# Patient Record
Sex: Female | Born: 1935 | Smoking: Never smoker
Health system: Southern US, Community
[De-identification: ages and names within clinical notes are randomized; demographics above are authoritative.]

## PROBLEM LIST (undated history)

## (undated) DIAGNOSIS — I4891 Unspecified atrial fibrillation: Secondary | ICD-10-CM

## (undated) DIAGNOSIS — I2510 Atherosclerotic heart disease of native coronary artery without angina pectoris: Secondary | ICD-10-CM

## (undated) DIAGNOSIS — K219 Gastro-esophageal reflux disease without esophagitis: Secondary | ICD-10-CM

## (undated) DIAGNOSIS — E785 Hyperlipidemia, unspecified: Secondary | ICD-10-CM

## (undated) DIAGNOSIS — I714 Abdominal aortic aneurysm, without rupture: Secondary | ICD-10-CM

## (undated) DIAGNOSIS — I10 Essential (primary) hypertension: Secondary | ICD-10-CM

## (undated) DIAGNOSIS — M810 Age-related osteoporosis without current pathological fracture: Secondary | ICD-10-CM

## (undated) DIAGNOSIS — E538 Deficiency of other specified B group vitamins: Secondary | ICD-10-CM

## (undated) HISTORY — DX: Atherosclerotic heart disease of native coronary artery without angina pectoris: I25.10

## (undated) HISTORY — PX: TONSILLECTOMY: SUR1361

## (undated) HISTORY — DX: Deficiency of other specified B group vitamins: E53.8

## (undated) HISTORY — DX: Abdominal aortic aneurysm, without rupture: I71.4

## (undated) HISTORY — PX: VAGINAL HYSTERECTOMY: SHX2639

## (undated) HISTORY — DX: Age-related osteoporosis without current pathological fracture: M81.0

## (undated) HISTORY — DX: Gastro-esophageal reflux disease without esophagitis: K21.9

## (undated) HISTORY — PX: CATARACT EXTRACTION, BILATERAL: SHX1313

## (undated) HISTORY — PX: CHOLECYSTECTOMY: SHX55

## (undated) HISTORY — DX: Unspecified atrial fibrillation: I48.91

## (undated) HISTORY — DX: Hyperlipidemia, unspecified: E78.5

## (undated) HISTORY — DX: Essential (primary) hypertension: I10

## (undated) HISTORY — PX: APPENDECTOMY: SHX54

---

## 1989-11-02 HISTORY — PX: CORONARY ARTERY BYPASS GRAFT: SHX141

## 1990-11-02 DIAGNOSIS — I714 Abdominal aortic aneurysm, without rupture, unspecified: Secondary | ICD-10-CM

## 1990-11-02 HISTORY — DX: Abdominal aortic aneurysm, without rupture, unspecified: I71.40

## 1990-11-02 HISTORY — DX: Abdominal aortic aneurysm, without rupture: I71.4

## 1990-11-02 HISTORY — PX: ABDOMINAL AORTIC ANEURYSM REPAIR: SUR1152

## 2005-10-20 ENCOUNTER — Ambulatory Visit: Payer: Self-pay | Admitting: Gastroenterology

## 2006-09-07 ENCOUNTER — Ambulatory Visit: Payer: Self-pay | Admitting: Family Medicine

## 2007-05-12 ENCOUNTER — Ambulatory Visit: Payer: Self-pay | Admitting: Family Medicine

## 2007-11-03 HISTORY — PX: CORONARY ANGIOPLASTY: SHX604

## 2007-12-13 ENCOUNTER — Inpatient Hospital Stay: Payer: Self-pay | Admitting: Specialist

## 2007-12-13 ENCOUNTER — Other Ambulatory Visit: Payer: Self-pay

## 2007-12-14 ENCOUNTER — Other Ambulatory Visit: Payer: Self-pay

## 2008-05-21 ENCOUNTER — Ambulatory Visit: Payer: Self-pay | Admitting: Family Medicine

## 2009-04-19 ENCOUNTER — Ambulatory Visit: Payer: Self-pay | Admitting: Unknown Physician Specialty

## 2009-05-23 ENCOUNTER — Ambulatory Visit: Payer: Self-pay | Admitting: Family Medicine

## 2009-09-05 ENCOUNTER — Emergency Department: Payer: Self-pay | Admitting: Emergency Medicine

## 2012-08-01 ENCOUNTER — Inpatient Hospital Stay: Payer: Self-pay | Admitting: Internal Medicine

## 2012-08-01 LAB — COMPREHENSIVE METABOLIC PANEL
Alkaline Phosphatase: 93 U/L (ref 50–136)
BUN: 29 mg/dL — ABNORMAL HIGH (ref 7–18)
Creatinine: 0.92 mg/dL (ref 0.60–1.30)
EGFR (Non-African Amer.): 57 — ABNORMAL LOW
Glucose: 132 mg/dL — ABNORMAL HIGH (ref 65–99)
Osmolality: 295 (ref 275–301)
Potassium: 4 mmol/L (ref 3.5–5.1)
SGOT(AST): 31 U/L (ref 15–37)
Sodium: 144 mmol/L (ref 136–145)

## 2012-08-01 LAB — CBC WITH DIFFERENTIAL/PLATELET
Basophil #: 0 10*3/uL (ref 0.0–0.1)
Basophil %: 0.3 %
Eosinophil #: 0 10*3/uL (ref 0.0–0.7)
Eosinophil %: 0.2 %
HGB: 11.7 g/dL — ABNORMAL LOW (ref 12.0–16.0)
Lymphocyte %: 27.5 %
MCH: 29 pg (ref 26.0–34.0)
Monocyte #: 0.7 x10 3/mm (ref 0.2–0.9)
Neutrophil %: 58.7 %
Platelet: 173 10*3/uL (ref 150–440)
RBC: 4.02 10*6/uL (ref 3.80–5.20)

## 2012-08-01 LAB — LIPASE, BLOOD: Lipase: 265 U/L (ref 73–393)

## 2012-08-01 LAB — TROPONIN I: Troponin-I: <0.02 ng/mL

## 2012-08-02 DIAGNOSIS — I4891 Unspecified atrial fibrillation: Secondary | ICD-10-CM

## 2012-08-02 DIAGNOSIS — I369 Nonrheumatic tricuspid valve disorder, unspecified: Secondary | ICD-10-CM

## 2012-08-02 LAB — BASIC METABOLIC PANEL
Calcium, Total: 8.3 mg/dL — ABNORMAL LOW (ref 8.5–10.1)
Chloride: 107 mmol/L (ref 98–107)
Co2: 28 mmol/L (ref 21–32)
Creatinine: 0.83 mg/dL (ref 0.60–1.30)
EGFR (African American): >60
EGFR (Non-African Amer.): >60
Osmolality: 288 (ref 275–301)
Potassium: 4 mmol/L (ref 3.5–5.1)

## 2012-08-02 LAB — CBC WITH DIFFERENTIAL/PLATELET
Basophil %: 0.2 %
Eosinophil #: 0 10*3/uL (ref 0.0–0.7)
HGB: 10.8 g/dL — ABNORMAL LOW (ref 12.0–16.0)
Lymphocyte #: 1.1 10*3/uL (ref 1.0–3.6)
Lymphocyte %: 29.4 %
MCH: 27.8 pg (ref 26.0–34.0)
MCHC: 32.2 g/dL (ref 32.0–36.0)
MCV: 86 fL (ref 80–100)
Monocyte #: 0.6 x10 3/mm (ref 0.2–0.9)
Neutrophil #: 2.1 10*3/uL (ref 1.4–6.5)
Neutrophil %: 55.4 %
Platelet: 145 10*3/uL — ABNORMAL LOW (ref 150–440)
RDW: 14.2 % (ref 11.5–14.5)
WBC: 3.9 10*3/uL (ref 3.6–11.0)

## 2012-08-02 LAB — TROPONIN I
Troponin-I: <0.02 ng/mL
Troponin-I: <0.02 ng/mL

## 2012-08-02 LAB — LIPID PANEL
Cholesterol: 105 mg/dL (ref 0–200)
Ldl Cholesterol, Calc: 40 mg/dL (ref 0–100)
Triglycerides: 75 mg/dL (ref 0–200)
VLDL Cholesterol, Calc: 15 mg/dL (ref 5–40)

## 2012-08-02 LAB — CK TOTAL AND CKMB (NOT AT ARMC)
CK, Total: 50 U/L (ref 21–215)
CK-MB: 1 ng/mL (ref 0.5–3.6)

## 2012-08-03 ENCOUNTER — Telehealth: Payer: Self-pay

## 2012-08-03 NOTE — Telephone Encounter (Signed)
Pt's dtr called to say pt did not have a lot of u/o this am, so she gave her an extra 20 mg lasix at 1100.  She still did not have very much u/o so she gave her an add'l 20 mg lasix at 1pm.  Dtr is concerned about the decreased u/o. Dtr says pt still has some DOE and feels better at rest.  She says heart rate is controlled. I had dtr hold while I discussed with Dr. Mariah Milling. Per Dr. Mariah Milling, he asks that I reassure pt and dtr that u/o should increase over time and pt may take lasix as she did today tomorrow for worsening sob, with extra KCL.  We will keep her appt as scheduled for Friday 10/4. Dtr was informed of this and will call us tomm if symptoms do not improve/if u/o remains low.

## 2012-08-03 NOTE — Telephone Encounter (Signed)
TCM call: I called pt to assess how she is feeling since d/c yesterday. She says she feel very weak and sob.  She says she felt this way in hosp, but was hoping to feel better at home.  She weighed herself last night , when she got home from hospital, and thinks her weight is up 3 pounds today. She lives alone but has the support of her dtr Pt confirms compliance with her medications and has taken these as prescribed this am. She does not have BP cuff to monitor BP/HR so she is unsure of these results  She has appt with Dr. Mariah Milling 10/4. She thinks she is ok to wait until appt to come in, but I told her I will call Dr. Mariah Milling to discuss and call her back.  Understanding verb.  She confirms she has a walker she uses for assist and will keep portable phone with her

## 2012-08-03 NOTE — Telephone Encounter (Signed)
Addendum: Pt is interested in CHF home health care, but is concerned about the cost.  I have called Tim Justis with [facility] and LMT[facility].

## 2012-08-03 NOTE — Telephone Encounter (Signed)
I spoke with Dr. Mariah Milling who says pt has pulmonary HTN with atrial fib and may need add'l lasix today and tomm to help with symptoms.  "Patient may take an extra 20 mg lasix today after lunch if urine output is adequate after this morning's lasix dose.  She may take an extra 40 mg lasix today after lunch if urine output is less than adequate after morning dose.  She will need to take extra 20 meq of KCL with extra PM lasix dose. She may repeat this regimen tomorrow 10/3 if still symptomatic.  She should alos monitor BP and HR at home with BP cuff.  Keep appt scheduled for 10/4 unless she needs to be seen sooner."  V.O Dr. Alvis Lemmings, RN  "If patient is agreeable, arrange CHF home health nurse". V.O Dr. Alvis Lemmings, RN  I will call pt to advise.

## 2012-08-03 NOTE — Telephone Encounter (Signed)
Dtr called and say she just got to pt's home.  She says she is unable to tell if pt is more sob than usual, but will stay with her 1-2 hours and call me back to give me her opinion.  In the meantime, she says pt is urinating "Ok" but not "alot". She will give pt extra 40 mg lasix PO today at 1100 with extra KCL.  She will call me back in 1-2 hours to update me on pt. I educated dtr on how to check radial pulse since pt does not have BP cuff at home. She will try to check pulse as well.  Dtr says pt used to be a pt of Dr. Lady Gary and also sees [facility] in [place].  We will try to get these records.  Dtr also wanted me to know pt found a "card in her wallet" that tells of [employer] in 1992. Dtr does not think pt told MD about this in hospital. We will add to her records.  I will await dtr's call back with her opinion on pt and if she needs to be seen today.

## 2012-08-03 NOTE — Telephone Encounter (Signed)
I called pt to assess symptoms.  She says she is still sitting in chair waiting on dtr, Bonita Quin, who is 8 miles away. Pt still has my name and # and will have dtr call me when she gets to pt's home  I also explained [facility] unable to take pt but I will call other agencies if pt is interested. She says she may consider this but will wait and talk with dtr further.

## 2012-08-03 NOTE — Telephone Encounter (Signed)
Haley Lee with [facility] called back. He explained since pt has [employer], they are not contracted with [facility] so services would not be covered. He suggests calling another agency.

## 2012-08-03 NOTE — Telephone Encounter (Signed)
I called pt back to inform pt of new orders per Dr. Mariah Milling. She says dtr helps her with her meds and gave me permission to call dtr, Bonita Quin, at [phone]. While speaking with me, she became very sob, unable to finish sentences without pausing for breath.  I asked her if she needs immediate help ([phone]), she declines, says her dtr lives nearby. I had pt hold on phone while I called dtr. I spoke with Bonita Quin and made her aware of pt's symptoms.  She says she spoke with pt this am and her only complaint was weakness.  She was unaware of pt's SOB.  Dtr is going to check on pt now. She will call me when she gets to pt home. I also informed dtr of Dr. Windell Hummingbird plan to give extra lasix and KCL as described below. She verb understanding and will implement if needed, once she checks on pt. I spoke with pt on phone again to let her know dtr is on her way.  I stayed on phone with pt for a few minutes.  She is going to sit in chair until dtr gets there. I explained pt may need to be seen today.  She verb. Understanding and c/o "shaking inside". This may be atrial fib she is describing but I am unsure of her rate. I made sure pt has my name and # and she is to call me back ASAP. I will also call her in a few minutes to check on her again until dtr gets there.

## 2012-08-04 ENCOUNTER — Telehealth: Payer: Self-pay

## 2012-08-04 NOTE — Telephone Encounter (Signed)
Pt's dtr called this am to say pt accidentally took 2 lasix 20 mg tablets this am with 2 KCL instead of 1 lasix 20 mg.  I advised ok since she was to take an extra 20 mg lasix at 1100 this am. I advised dtr to not give pt another lasix 20 mg this afternoon unless u/o is decreased (as instructed yesterday). Dtr verb. Understanding. Says U/O has actually improved since early this am.  Says pt's DOE has improved and confirms appt with [place] tomm.

## 2012-08-05 ENCOUNTER — Encounter: Payer: Self-pay | Admitting: Cardiovascular Disease

## 2012-08-05 ENCOUNTER — Ambulatory Visit (INDEPENDENT_AMBULATORY_CARE_PROVIDER_SITE_OTHER): Payer: Medicare Other | Admitting: Cardiovascular Disease

## 2012-08-05 VITALS — BP 161/102 | HR 107 | Ht 61.0 in | Wt 155.8 lb

## 2012-08-05 DIAGNOSIS — E785 Hyperlipidemia, unspecified: Secondary | ICD-10-CM | POA: Insufficient documentation

## 2012-08-05 DIAGNOSIS — I5031 Acute diastolic (congestive) heart failure: Secondary | ICD-10-CM | POA: Insufficient documentation

## 2012-08-05 DIAGNOSIS — I509 Heart failure, unspecified: Secondary | ICD-10-CM

## 2012-08-05 DIAGNOSIS — I10 Essential (primary) hypertension: Secondary | ICD-10-CM

## 2012-08-05 DIAGNOSIS — I25810 Atherosclerosis of coronary artery bypass graft(s) without angina pectoris: Secondary | ICD-10-CM | POA: Insufficient documentation

## 2012-08-05 DIAGNOSIS — I4891 Unspecified atrial fibrillation: Secondary | ICD-10-CM

## 2012-08-05 DIAGNOSIS — R609 Edema, unspecified: Secondary | ICD-10-CM

## 2012-08-05 MED ORDER — METOPROLOL TARTRATE 50 MG PO TABS: 50.0000 mg | ORAL_TABLET | Freq: Two times a day (BID) | ORAL | Status: DC

## 2012-08-05 MED ORDER — DIGOXIN 125 MCG PO TABS: 0.1250 mg | ORAL_TABLET | Freq: Every day | ORAL | Status: DC

## 2012-08-05 NOTE — Assessment & Plan Note (Signed)
Leg edema likely secondary to diastolic CHF and possible side effects from calcium channel blocker. If edema persists, we could slowly decrease the dose of diltiazem, increase metoprolol and continue digoxin for rate control. We would make this change on her next visit.

## 2012-08-05 NOTE — Assessment & Plan Note (Signed)
Currently with no symptoms of angina. No further workup at this time. Continue current medication regimen. 

## 2012-08-05 NOTE — Patient Instructions (Addendum)
  Please start metoprolol twice a day, 50 mg Hold the losartan,  Monitor your blood pressure Continue lasix with potassium one a day Please start digoxin one a day  Please call us if you have new issues that need to be addressed before your next appt.  Your physician wants you to follow-up in: 1 to 2 weeks

## 2012-08-05 NOTE — Assessment & Plan Note (Addendum)
Heart rate continues to be elevated. We will add metoprolol 50 mg twice a day  and low-dose digoxin 0.125 mg daily. We did discuss TE cardioversion. We will try medical management into next week.

## 2012-08-05 NOTE — Assessment & Plan Note (Signed)
Diastolic CHF likely secondary to atrial fibrillation and rapid rate. We will work on rate control. We have suggested she stay on Lasix daily. If she continues to have significant symptoms, we may need to consider cardioversion.

## 2012-08-05 NOTE — Assessment & Plan Note (Signed)
We have added. metoprolol today and has suggested she closely monitor her blood pressure at home

## 2012-08-05 NOTE — Assessment & Plan Note (Signed)
We have suggested she stay on a statin given history of coronary artery disease.

## 2012-08-05 NOTE — Progress Notes (Signed)
Patient ID: Haley Lee, female    DOB: [birth year], 78 y.o.   MRN: [id number]  HPI Comments: Haley Lee is a very pleasant 78 year old woman with a history of coronary artery disease, bypass surgery, angioplasty, hypertension, recent mechanical fall several weeks ago who is not feeling well with shortness of breath, weakness and palpitations and presented to the hospital 08/01/2012, found to be in atrial fibrillation with RVR. She was started on diltiazem, anticoagulation and diuretic with mild improvement of her symptoms. Cardiac enzymes were negative.  She presents today for followup. She reports continued fatigue, lower extremity edema despite extra doses of diuretic. She is very concerned about the extra pulsation that she sees in her neck and her friends have been asking her what is "wrong with her". At nighttime she feels palpitations and is very symptomatic.  Lab work from the hospital shows TSH 2.6, negative cardiac enzymes  Echocardiogram showed ejection fraction 40-45%, mildly dilated left atrium, mild to moderate MR, severe TR with right ventricular systolic pressure 50-60 mm of mercury, mild to moderate aortic valve regurgitation  EKG today shows atrial fibrillation with ventricular rate 107 beats per minute, poor R-wave progression through the precordial leads, left axis deviation, possible left anterior fascicular block   Outpatient Encounter Prescriptions as of 08/05/2012  Medication Sig Dispense Refill  . aspirin 81 MG tablet Take 81 mg by mouth daily.      . Calcium Carbonate-Vitamin D (CALCIUM 600 + D PO) Take by mouth.      . Cholecalciferol (VITAMIN D-3 PO) Take 400 Units by mouth daily.      Marland Kitchen diltiazem (DILACOR XR) 180 MG 24 hr capsule Take 180 mg by mouth daily.      . furosemide (LASIX) 20 MG tablet Take 20 mg by mouth daily.       . meloxicam (MOBIC) 15 MG tablet Take 15 mg by mouth daily.      . potassium chloride SA (K-DUR,KLOR-CON) 20 MEQ tablet Take 20 mEq by mouth  daily.      . simvastatin (ZOCOR) 40 MG tablet Take 40 mg by mouth every evening.      . warfarin (COUMADIN) 3 MG tablet Take 3 mg by mouth daily.      Marland Kitchen  losartan (COZAAR) 100 MG tablet Take 100 mg by mouth daily.        Review of Systems  Constitutional: Negative.   HENT: Negative.   Eyes: Negative.   Respiratory: Positive for shortness of breath.   Cardiovascular: Positive for palpitations and leg swelling.  Gastrointestinal: Negative.   Musculoskeletal: Negative.   Skin: Negative.   Neurological: Negative.   Hematological: Negative.   Psychiatric/Behavioral: Negative.   All other systems reviewed and are negative.    BP 161/102  Pulse 107  Ht 5\' 1"  (1.549 m)  Wt 155 lb 12 oz (70.648 kg)  BMI 29.43 kg/m2  SpO2 97%  Physical Exam  Nursing note and vitals reviewed. Constitutional: She is oriented to person, place, and time. She appears well-developed and well-nourished.  HENT:  Head: Normocephalic.  Nose: Nose normal.  Mouth/Throat: Oropharynx is clear and moist.  Eyes: Conjunctivae normal are normal. Pupils are equal, round, and reactive to light.  Neck: Normal range of motion. Neck supple. No JVD present.  Cardiovascular: S1 normal, S2 normal, normal heart sounds and intact distal pulses.  An irregularly irregular rhythm present. Tachycardia present.  Exam reveals no gallop and no friction rub.   No murmur heard.  Trace ankle edema  Pulmonary/Chest: Effort normal and breath sounds normal. No respiratory distress. She has no wheezes. She has no rales. She exhibits no tenderness.  Abdominal: Soft. Bowel sounds are normal. She exhibits no distension. There is no tenderness.  Musculoskeletal: Normal range of motion. She exhibits edema. She exhibits no tenderness.  Lymphadenopathy:    She has no cervical adenopathy.  Neurological: She is alert and oriented to person, place, and time. Coordination normal.  Skin: Skin is warm and dry. No rash noted. No erythema.    Psychiatric: She has a normal mood and affect. Her behavior is normal. Judgment and thought content normal.         Assessment and Plan

## 2012-08-08 ENCOUNTER — Telehealth: Payer: Self-pay | Admitting: Cardiovascular Disease

## 2012-08-08 NOTE — Telephone Encounter (Signed)
I spoke with pt's dtr, Bonita Quin. She says no one is managing pt's coumadin.  I explained importance of this.  Dtr verb. Understanding and would like to have pt enrolled in our clinic.  Appt made for this Wednesday at 3:40 for new pt appt in coumadin clinic.   I explained Dr. Mariah Milling needs to assess this as well as EKG before scheduling for cardioversion/TEE.  Understanding verb. Will get INR and EKG Wednesday 08/10/12

## 2012-08-08 NOTE — Telephone Encounter (Signed)
Discussed with Dr. Mariah Milling, who says we need to get most recent INRs from provider.  Pt should monitor HR and BP at home.  He needs to know what her HR have been.  He is willing to schedule TEE/Cardioversion but we need to get INR's. I will call pt to advise.

## 2012-08-08 NOTE — Telephone Encounter (Signed)
Pt's dtr says pt started metoprolol and digoxin as prescribed on Friday by Dr. Mariah Milling. She says pt is still c/o severe DOE as well as increased fatigue/weakness on exertion.  Also c/o left sided pain under left breast as well as left arm pain.  Pt is wanting to go ahead and proceed with TEE/Cardioversion. Edema is unchanged while taking lasix.  I told dtr I would share this info with Dr. Mariah Milling and call her back. Understanding verb.

## 2012-08-08 NOTE — Telephone Encounter (Signed)
Pt daughter was calling to give an update on pt. Pt does not have any energy and heart races with excertion. Pain in arms and under ribs.

## 2012-08-10 ENCOUNTER — Ambulatory Visit (INDEPENDENT_AMBULATORY_CARE_PROVIDER_SITE_OTHER): Payer: Medicare Other

## 2012-08-10 ENCOUNTER — Other Ambulatory Visit: Payer: Self-pay | Admitting: Cardiovascular Disease

## 2012-08-10 DIAGNOSIS — Z7901 Long term (current) use of anticoagulants: Secondary | ICD-10-CM

## 2012-08-10 DIAGNOSIS — I4891 Unspecified atrial fibrillation: Secondary | ICD-10-CM

## 2012-08-10 MED ORDER — POTASSIUM CHLORIDE CRYS ER 20 MEQ PO TBCR: 20.0000 meq | EXTENDED_RELEASE_TABLET | Freq: Every day | ORAL | Status: DC

## 2012-08-10 MED ORDER — FUROSEMIDE 20 MG PO TABS: 20.0000 mg | ORAL_TABLET | Freq: Every day | ORAL | Status: DC

## 2012-08-10 NOTE — Telephone Encounter (Signed)
Please review and refill, Thank you. 

## 2012-08-10 NOTE — Patient Instructions (Signed)

## 2012-08-10 NOTE — Telephone Encounter (Signed)
Refilled Potassium and Lasix.

## 2012-08-11 ENCOUNTER — Other Ambulatory Visit: Payer: Self-pay | Admitting: Cardiovascular Disease

## 2012-08-11 MED ORDER — WARFARIN SODIUM 6 MG PO TABS: 6.0000 mg | ORAL_TABLET | Freq: Every day | ORAL | Status: DC

## 2012-08-11 NOTE — Telephone Encounter (Signed)
Spoke with Rosanne Ashing about the confusion with warfarin. Told Jim to cancel the warfarin 6 mg that was phoned in because the patient and daughter thought the patient would run out. There was some confusion with some insurance and patient's daughter was confused on what to do.  I told the patient's daughter we would cancel the warfarin 6 mg since the patient is coming in on Monday for a PT/INR check. I told the patient's daughter the dose may change again and if she has enough until Monday no need to change the Rx at this time.

## 2012-08-11 NOTE — Telephone Encounter (Signed)
Pt needs script written so that she is taking 2 3mg  daily due to increase per [facility] yesterday

## 2012-08-12 NOTE — Telephone Encounter (Signed)
The patient's daughter will have the patient continue with the warfarin 3 mg taking two tablets as instructed by the coag clinic.

## 2012-08-15 ENCOUNTER — Ambulatory Visit (INDEPENDENT_AMBULATORY_CARE_PROVIDER_SITE_OTHER): Payer: Medicare Other

## 2012-08-15 ENCOUNTER — Encounter: Payer: Self-pay | Admitting: Cardiovascular Disease

## 2012-08-15 ENCOUNTER — Ambulatory Visit (INDEPENDENT_AMBULATORY_CARE_PROVIDER_SITE_OTHER): Payer: Medicare Other | Admitting: Cardiovascular Disease

## 2012-08-15 VITALS — BP 124/64 | HR 53 | Ht 61.0 in | Wt 146.8 lb

## 2012-08-15 DIAGNOSIS — I4891 Unspecified atrial fibrillation: Secondary | ICD-10-CM

## 2012-08-15 DIAGNOSIS — Z7901 Long term (current) use of anticoagulants: Secondary | ICD-10-CM

## 2012-08-15 DIAGNOSIS — I5031 Acute diastolic (congestive) heart failure: Secondary | ICD-10-CM

## 2012-08-15 DIAGNOSIS — I509 Heart failure, unspecified: Secondary | ICD-10-CM

## 2012-08-15 DIAGNOSIS — R609 Edema, unspecified: Secondary | ICD-10-CM

## 2012-08-15 DIAGNOSIS — I25810 Atherosclerosis of coronary artery bypass graft(s) without angina pectoris: Secondary | ICD-10-CM

## 2012-08-15 LAB — POCT INR: INR: 1.6

## 2012-08-15 MED ORDER — POTASSIUM CHLORIDE CRYS ER 20 MEQ PO TBCR: 20.0000 meq | EXTENDED_RELEASE_TABLET | Freq: Every day | ORAL | Status: AC | PRN

## 2012-08-15 MED ORDER — WARFARIN SODIUM 6 MG PO TABS: ORAL_TABLET | ORAL | Status: DC

## 2012-08-15 MED ORDER — FUROSEMIDE 20 MG PO TABS: 20.0000 mg | ORAL_TABLET | Freq: Every day | ORAL | Status: AC | PRN

## 2012-08-15 MED ORDER — WARFARIN SODIUM 7.5 MG PO TABS: ORAL_TABLET | ORAL | Status: DC

## 2012-08-15 MED ORDER — METOPROLOL TARTRATE 25 MG PO TABS: 25.0000 mg | ORAL_TABLET | Freq: Two times a day (BID) | ORAL | Status: DC

## 2012-08-15 MED ORDER — DILTIAZEM HCL ER 120 MG PO CP24: 120.0000 mg | ORAL_CAPSULE | Freq: Every day | ORAL | Status: AC

## 2012-08-15 MED ORDER — DIGOXIN 125 MCG PO TABS: 0.1250 mg | ORAL_TABLET | ORAL | Status: DC

## 2012-08-15 NOTE — Assessment & Plan Note (Signed)
Edema has resolved. Shortness of breath improved. We will change Lasix to as needed for edema.

## 2012-08-15 NOTE — Progress Notes (Signed)
Patient ID: Haley Lee, female    DOB: 1925-02-11, 78 y.o.   MRN: [id number]  HPI Comments: Ms. Linehan is a very pleasant 78 year old woman with a history of coronary artery disease, bypass surgery, angioplasty, hypertension, recent mechanical fall several weeks ago who is not feeling well with shortness of breath, weakness and palpitations and presented to the hospital 08/01/2012, found to be in atrial fibrillation with RVR. She was started on diltiazem, anticoagulation and diuretic with mild improvement of her symptoms. Cardiac enzymes were negative.  She presents today and reports that deep palpation in her neck has improved. She is more "wobbly" on her feet than before. Her daughter is helping her with the medications. Edema has resolved on her current medication regimen.   Lab work from the hospital shows TSH 2.6, negative cardiac enzymes  Echocardiogram showed ejection fraction 40-45%, mildly dilated left atrium, mild to moderate MR, severe TR with right ventricular systolic pressure 50-60 mm of mercury, mild to moderate aortic valve regurgitation  EKG today shows atrial fibrillation with rate 53 beats per minute, poor R-wave progression through the anterior precordial leads, left anterior fascicular block   Outpatient Encounter Prescriptions as of 08/15/2012  Medication Sig Dispense Refill  . aspirin 81 MG tablet Take 81 mg by mouth daily.      . Calcium Carbonate-Vitamin D (CALCIUM 600 + D PO) Take by mouth.      . Cholecalciferol (VITAMIN D-3 PO) Take 400 Units by mouth daily.      . meloxicam (MOBIC) 15 MG tablet Take 15 mg by mouth daily.      . simvastatin (ZOCOR) 40 MG tablet Take 40 mg by mouth every evening.      .  digoxin (LANOXIN) 0.125 MG tablet Take 1 tablet (0.125 mg total) by mouth daily.  90 tablet  3  . diltiazem (DILACOR XR) 180 MG 24 hr capsule Take 180 mg by mouth daily.      .  furosemide (LASIX) 20 MG tablet Take 1 tablet (20 mg total) by mouth daily.  30 tablet   3  .  metoprolol (LOPRESSOR) 50 MG tablet Take 1 tablet (50 mg total) by mouth 2 (two) times daily.  180 tablet  3  .  potassium chloride SA (K-DUR,KLOR-CON) 20 MEQ tablet Take 1 tablet (20 mEq total) by mouth daily.  30 tablet  3  . warfarin (COUMADIN) 6 MG tablet Take 1 tablet daily as directed  34 tablet  3     Review of Systems  Constitutional: Negative.   HENT: Negative.   Eyes: Negative.   Cardiovascular: Positive for palpitations.  Gastrointestinal: Negative.   Musculoskeletal: Positive for gait problem.  Skin: Negative.   Neurological: Negative.   Hematological: Negative.   Psychiatric/Behavioral: Negative.   All other systems reviewed and are negative.    BP 124/64  Pulse 53  Ht 5\' 1"  (1.549 m)  Wt 146 lb 12 oz (66.565 kg)  BMI 27.73 kg/m2  Physical Exam  Nursing note and vitals reviewed. Constitutional: She is oriented to person, place, and time. She appears well-developed and well-nourished.  HENT:  Head: Normocephalic.  Nose: Nose normal.  Mouth/Throat: Oropharynx is clear and moist.  Eyes: Conjunctivae normal are normal. Pupils are equal, round, and reactive to light.  Neck: Normal range of motion. Neck supple. No JVD present.  Cardiovascular: Normal rate, S1 normal, S2 normal, normal heart sounds and intact distal pulses.  An irregularly irregular rhythm present. Exam reveals no gallop and no  friction rub.   No murmur heard.      Trace ankle edema  Pulmonary/Chest: Effort normal and breath sounds normal. No respiratory distress. She has no wheezes. She has no rales. She exhibits no tenderness.  Abdominal: Soft. Bowel sounds are normal. She exhibits no distension. There is no tenderness.  Musculoskeletal: Normal range of motion. She exhibits no tenderness.  Lymphadenopathy:    She has no cervical adenopathy.  Neurological: She is alert and oriented to person, place, and time. Coordination normal.  Skin: Skin is warm and dry. No rash noted. No erythema.    Psychiatric: She has a normal mood and affect. Her behavior is normal. Judgment and thought content normal.         Assessment and Plan

## 2012-08-15 NOTE — Patient Instructions (Addendum)
Please decrease the diltiazem/cardizem to 120 mg pill once a day Take digoxin every other day (or four times a week) Please cut the metoprolol in 1/2 and take twice a day  Take warfarin 6 mg daily with 9 mg (two days a week, 1 1/2 pills twice a week)  Weight yourself at home daily Goal weight is 140 to 144 If your weight goes up more then 4 pounds, then take a lasix with potassium Only take potassium when you take lasix  Please call us if you have new issues that need to be addressed before your next appt.  Your physician wants you to follow-up in: 1 month.

## 2012-08-15 NOTE — Assessment & Plan Note (Signed)
Edema has resolved with diuretic. We'll change Lasix to as needed

## 2012-08-15 NOTE — Assessment & Plan Note (Signed)
Currently with no symptoms of angina. No further workup at this time. Continue current medication regimen. 

## 2012-08-15 NOTE — Assessment & Plan Note (Signed)
Heart rate is slowed today. We will change digoxin to every other day, decrease metoprolol to 25 mg twice a day, decrease diltiazem to 120 mg daily. Continue warfarin. After anticoagulation for one month at appropriate level, consider cardioversion

## 2012-08-15 NOTE — Assessment & Plan Note (Signed)
Warfarin adjusted today, 6 mg daily with 9 mg 2 days per week with check in 10 days.

## 2012-08-22 ENCOUNTER — Telehealth: Payer: Self-pay

## 2012-08-22 NOTE — Telephone Encounter (Signed)
Pt's dtr called, wanted [place] to know pt has had to take lasix with potassium over the last 3 days. Says her weight has gone from 140 pounds to 142 pounds, then jumped to 145 pounds.  Per Dr. Windell Hummingbird last Office note, she was told to take lasix/potassium only PRN weight gain to 144 pounds or greater.  Dtr says pt's u/o is good but weight has not changed. She has weighed 145 pounds over the last 3 days. Denies worsening sob or edema. I educated her on importance of restricting sodium as much as possible and restricting fluid intake.  She will let us know should weight continue to remain greater than 144 pounds.

## 2012-08-24 ENCOUNTER — Ambulatory Visit (INDEPENDENT_AMBULATORY_CARE_PROVIDER_SITE_OTHER): Payer: Medicare Other

## 2012-08-24 DIAGNOSIS — Z7901 Long term (current) use of anticoagulants: Secondary | ICD-10-CM

## 2012-08-24 DIAGNOSIS — I4891 Unspecified atrial fibrillation: Secondary | ICD-10-CM

## 2012-08-25 ENCOUNTER — Telehealth: Payer: Self-pay

## 2012-08-25 NOTE — Telephone Encounter (Signed)
Pt came in for coumadin visit 08/24/12 Says weight is down, sob is better. She will continue to take lasix PRN weight gain over 144 pounds (per dr. Windell Hummingbird instruction at last OV)

## 2012-08-31 ENCOUNTER — Ambulatory Visit (INDEPENDENT_AMBULATORY_CARE_PROVIDER_SITE_OTHER): Payer: Medicare Other

## 2012-08-31 DIAGNOSIS — I4891 Unspecified atrial fibrillation: Secondary | ICD-10-CM

## 2012-08-31 DIAGNOSIS — Z7901 Long term (current) use of anticoagulants: Secondary | ICD-10-CM

## 2012-09-14 ENCOUNTER — Encounter: Payer: Self-pay | Admitting: Cardiovascular Disease

## 2012-09-14 ENCOUNTER — Ambulatory Visit (INDEPENDENT_AMBULATORY_CARE_PROVIDER_SITE_OTHER): Payer: Medicare Other

## 2012-09-14 ENCOUNTER — Ambulatory Visit (INDEPENDENT_AMBULATORY_CARE_PROVIDER_SITE_OTHER): Payer: Medicare Other | Admitting: Cardiovascular Disease

## 2012-09-14 VITALS — BP 124/62 | HR 63 | Ht 61.0 in | Wt 147.2 lb

## 2012-09-14 DIAGNOSIS — I509 Heart failure, unspecified: Secondary | ICD-10-CM

## 2012-09-14 DIAGNOSIS — Z7901 Long term (current) use of anticoagulants: Secondary | ICD-10-CM

## 2012-09-14 DIAGNOSIS — I4891 Unspecified atrial fibrillation: Secondary | ICD-10-CM

## 2012-09-14 DIAGNOSIS — R0602 Shortness of breath: Secondary | ICD-10-CM

## 2012-09-14 DIAGNOSIS — I5031 Acute diastolic (congestive) heart failure: Secondary | ICD-10-CM

## 2012-09-14 DIAGNOSIS — R609 Edema, unspecified: Secondary | ICD-10-CM

## 2012-09-14 DIAGNOSIS — I25810 Atherosclerosis of coronary artery bypass graft(s) without angina pectoris: Secondary | ICD-10-CM

## 2012-09-14 NOTE — Assessment & Plan Note (Signed)
Edema has essentially resolved. She takes Lasix periodically depending on her weight. She seems to have this perfected.

## 2012-09-14 NOTE — Patient Instructions (Addendum)
You are doing well. No medication changes were made.  Please call us if you have new issues that need to be addressed before your next appt.  Your physician wants you to follow-up in: 6 months.  You will receive a reminder letter in the mail two months in advance. If you don't receive a letter, please call our office to schedule the follow-up appointment.   

## 2012-09-14 NOTE — Assessment & Plan Note (Signed)
She appears relatively euvolemic on today's visit No changes to her medications 

## 2012-09-14 NOTE — Assessment & Plan Note (Signed)
Currently with no symptoms of angina. No further workup at this time. Continue current medication regimen. 

## 2012-09-14 NOTE — Assessment & Plan Note (Signed)
Heart rate is well controlled. She continues to be in atrial fibrillation. We did have a long discussion about various treatment options for her. We did discuss cardioversion versus medical management. Her daughter suggests medical management at this time as she is feeling well apart from mild fatigue. She will think about this. If symptoms get worse, I suggested she call us and we will do cardioversion under anesthesia.

## 2012-09-14 NOTE — Progress Notes (Signed)
Patient ID: Nana Cohn, female    DOB: 05/29/1925, 78 y.o.   MRN: [id number]  HPI Comments: Ms. Eckmann is a very pleasant 78 year old woman with a history of coronary artery disease, bypass surgery, angioplasty, hypertension, recent mechanical fall several weeks ago who is not feeling well with shortness of breath, weakness and palpitations and presented to the hospital 08/01/2012, found to be in atrial fibrillation with RVR. She was started on diltiazem, anticoagulation and diuretic with mild improvement of her symptoms. Cardiac enzymes were negative.  On her last clinic visit, we decreased her metoprolol dose, changed diltiazem to every other day and decreased her Cardizem dose. Heart rate was in the low 50s at the time.  She returns today and reports that she feels well overall. She does continue to have fatigue. She denies any significant shortness of breath, edema. Wound continues to be poor. Her daughter is helping her with the medications.   Lab work from the hospital shows TSH 2.6, negative cardiac enzymes  Echocardiogram showed ejection fraction 40-45%, mildly dilated left atrium, mild to moderate MR, severe TR with right ventricular systolic pressure 50-60 mm of mercury, mild to moderate aortic valve regurgitation  EKG today shows atrial fibrillation with rate 77 beats per minute, poor R-wave progression through the anterior precordial leads, left anterior fascicular block   Outpatient Encounter Prescriptions as of 09/14/2012  Medication Sig Dispense Refill  . aspirin 81 MG tablet Take 81 mg by mouth daily.      . Calcium Carbonate-Vitamin D (CALCIUM 600 + D PO) Take by mouth.      . Cholecalciferol (VITAMIN D-3 PO) Take 400 Units by mouth daily.      . digoxin (LANOXIN) 0.125 MG tablet Take 1 tablet (0.125 mg total) by mouth every other day.  45 tablet  3  . diltiazem (DILACOR XR) 120 MG 24 hr capsule Take 1 capsule (120 mg total) by mouth daily.  90 capsule  3  . furosemide  (LASIX) 20 MG tablet Take 1 tablet (20 mg total) by mouth daily as needed.  30 tablet  3  . meloxicam (MOBIC) 15 MG tablet Take 15 mg by mouth daily.      . metoprolol (LOPRESSOR) 25 MG tablet Take 1 tablet (25 mg total) by mouth 2 (two) times daily.  180 tablet  3  . potassium chloride SA (K-DUR,KLOR-CON) 20 MEQ tablet Take 1 tablet (20 mEq total) by mouth daily as needed.  30 tablet  3  . simvastatin (ZOCOR) 40 MG tablet Take 40 mg by mouth every evening.      . warfarin (COUMADIN) 6 MG tablet Take 1 tablet daily as directed  34 tablet  3     Review of Systems  Constitutional: Negative.   HENT: Negative.   Eyes: Negative.   Respiratory: Negative.   Cardiovascular: Positive for palpitations.  Gastrointestinal: Negative.   Musculoskeletal: Positive for gait problem.  Skin: Negative.   Neurological: Negative.   Hematological: Negative.   Psychiatric/Behavioral: Negative.   All other systems reviewed and are negative.    BP 124/62  Pulse 63  Ht 5\' 1"  (1.549 m)  Wt 147 lb 4 oz (66.792 kg)  BMI 27.82 kg/m2  Physical Exam  Nursing note and vitals reviewed. Constitutional: She is oriented to person, place, and time. She appears well-developed and well-nourished.  HENT:  Head: Normocephalic.  Nose: Nose normal.  Mouth/Throat: Oropharynx is clear and moist.  Eyes: Conjunctivae normal are normal. Pupils are equal, round, and  reactive to light.  Neck: Normal range of motion. Neck supple. No JVD present.  Cardiovascular: Normal rate, S1 normal, S2 normal, normal heart sounds and intact distal pulses.  An irregularly irregular rhythm present. Exam reveals no gallop and no friction rub.   No murmur heard.      Trace ankle edema  Pulmonary/Chest: Effort normal and breath sounds normal. No respiratory distress. She has no wheezes. She has no rales. She exhibits no tenderness.  Abdominal: Soft. Bowel sounds are normal. She exhibits no distension. There is no tenderness.  Musculoskeletal:  Normal range of motion. She exhibits no edema and no tenderness.  Lymphadenopathy:    She has no cervical adenopathy.  Neurological: She is alert and oriented to person, place, and time. Coordination normal.  Skin: Skin is warm and dry. No rash noted. No erythema.  Psychiatric: She has a normal mood and affect. Her behavior is normal. Judgment and thought content normal.         Assessment and Plan

## 2012-09-16 ENCOUNTER — Emergency Department: Payer: Self-pay | Admitting: Emergency Medicine

## 2012-09-17 ENCOUNTER — Emergency Department: Payer: Self-pay | Admitting: Emergency Medicine

## 2012-09-17 LAB — BASIC METABOLIC PANEL
Calcium, Total: 8.5 mg/dL (ref 8.5–10.1)
Co2: 28 mmol/L (ref 21–32)
EGFR (African American): 54 — ABNORMAL LOW
EGFR (Non-African Amer.): 46 — ABNORMAL LOW
Glucose: 109 mg/dL — ABNORMAL HIGH (ref 65–99)
Osmolality: 285 (ref 275–301)
Potassium: 3.8 mmol/L (ref 3.5–5.1)
Sodium: 140 mmol/L (ref 136–145)

## 2012-09-17 LAB — CBC
HCT: 34.7 % — ABNORMAL LOW (ref 35.0–47.0)
HGB: 11.9 g/dL — ABNORMAL LOW (ref 12.0–16.0)
MCH: 28.6 pg (ref 26.0–34.0)
MCHC: 34.3 g/dL (ref 32.0–36.0)
MCV: 84 fL (ref 80–100)
RDW: 15.9 % — ABNORMAL HIGH (ref 11.5–14.5)
WBC: 4.8 10*3/uL (ref 3.6–11.0)

## 2012-09-17 LAB — PROTIME-INR
INR: 2.7
Prothrombin Time: 29 secs — ABNORMAL HIGH (ref 11.5–14.7)

## 2012-09-19 ENCOUNTER — Encounter: Payer: Self-pay | Admitting: Cardiovascular Disease

## 2012-09-21 ENCOUNTER — Telehealth: Payer: Self-pay

## 2012-09-21 NOTE — Telephone Encounter (Signed)
Error

## 2012-09-28 ENCOUNTER — Other Ambulatory Visit: Payer: Self-pay

## 2012-09-28 ENCOUNTER — Ambulatory Visit (INDEPENDENT_AMBULATORY_CARE_PROVIDER_SITE_OTHER): Payer: Medicare Other

## 2012-09-28 DIAGNOSIS — I4891 Unspecified atrial fibrillation: Secondary | ICD-10-CM

## 2012-09-28 DIAGNOSIS — Z7901 Long term (current) use of anticoagulants: Secondary | ICD-10-CM

## 2012-09-28 LAB — PROTIME-INR: INR: 8.2 — AB (ref <=1.1)

## 2012-09-28 LAB — POCT INR: INR: 7.8

## 2012-09-28 NOTE — Patient Instructions (Addendum)
Advised pt's daughter to report to ED with any sign and symptoms of abnormal bleeding. Verbalized understanding.

## 2012-10-03 ENCOUNTER — Encounter (INDEPENDENT_AMBULATORY_CARE_PROVIDER_SITE_OTHER): Payer: Medicare Other

## 2012-10-03 ENCOUNTER — Ambulatory Visit (INDEPENDENT_AMBULATORY_CARE_PROVIDER_SITE_OTHER): Payer: Medicare Other | Admitting: Cardiovascular Disease

## 2012-10-03 DIAGNOSIS — I4891 Unspecified atrial fibrillation: Secondary | ICD-10-CM

## 2012-10-03 DIAGNOSIS — Z7901 Long term (current) use of anticoagulants: Secondary | ICD-10-CM

## 2012-10-07 ENCOUNTER — Ambulatory Visit: Payer: Self-pay | Admitting: Podiatry

## 2012-10-07 ENCOUNTER — Ambulatory Visit (INDEPENDENT_AMBULATORY_CARE_PROVIDER_SITE_OTHER): Payer: Medicare Other

## 2012-10-07 DIAGNOSIS — I4891 Unspecified atrial fibrillation: Secondary | ICD-10-CM

## 2012-10-07 DIAGNOSIS — Z7901 Long term (current) use of anticoagulants: Secondary | ICD-10-CM

## 2012-10-19 ENCOUNTER — Ambulatory Visit (INDEPENDENT_AMBULATORY_CARE_PROVIDER_SITE_OTHER): Payer: Medicare Other

## 2012-10-19 DIAGNOSIS — Z7901 Long term (current) use of anticoagulants: Secondary | ICD-10-CM

## 2012-10-19 DIAGNOSIS — I4891 Unspecified atrial fibrillation: Secondary | ICD-10-CM

## 2012-10-19 LAB — POCT INR: INR: 2.8

## 2012-11-09 ENCOUNTER — Ambulatory Visit (INDEPENDENT_AMBULATORY_CARE_PROVIDER_SITE_OTHER): Payer: Medicare Other

## 2012-11-09 DIAGNOSIS — I4891 Unspecified atrial fibrillation: Secondary | ICD-10-CM

## 2012-11-09 DIAGNOSIS — Z7901 Long term (current) use of anticoagulants: Secondary | ICD-10-CM

## 2012-12-07 ENCOUNTER — Ambulatory Visit (INDEPENDENT_AMBULATORY_CARE_PROVIDER_SITE_OTHER): Payer: Medicare Other

## 2012-12-07 DIAGNOSIS — I4891 Unspecified atrial fibrillation: Secondary | ICD-10-CM

## 2012-12-07 DIAGNOSIS — Z7901 Long term (current) use of anticoagulants: Secondary | ICD-10-CM

## 2012-12-28 ENCOUNTER — Ambulatory Visit (INDEPENDENT_AMBULATORY_CARE_PROVIDER_SITE_OTHER): Payer: Medicare Other

## 2013-01-18 ENCOUNTER — Ambulatory Visit (INDEPENDENT_AMBULATORY_CARE_PROVIDER_SITE_OTHER): Payer: Medicare Other

## 2013-01-18 DIAGNOSIS — I4891 Unspecified atrial fibrillation: Secondary | ICD-10-CM

## 2013-02-08 ENCOUNTER — Ambulatory Visit (INDEPENDENT_AMBULATORY_CARE_PROVIDER_SITE_OTHER): Payer: Medicare Other

## 2013-02-08 DIAGNOSIS — I4891 Unspecified atrial fibrillation: Secondary | ICD-10-CM

## 2013-02-08 DIAGNOSIS — Z7901 Long term (current) use of anticoagulants: Secondary | ICD-10-CM

## 2013-02-08 MED ORDER — WARFARIN SODIUM 6 MG PO TABS: ORAL_TABLET | ORAL | Status: AC

## 2013-03-08 ENCOUNTER — Ambulatory Visit (INDEPENDENT_AMBULATORY_CARE_PROVIDER_SITE_OTHER): Payer: Medicare Other

## 2013-03-08 DIAGNOSIS — I4891 Unspecified atrial fibrillation: Secondary | ICD-10-CM

## 2013-03-08 DIAGNOSIS — Z7901 Long term (current) use of anticoagulants: Secondary | ICD-10-CM

## 2013-03-22 ENCOUNTER — Ambulatory Visit: Payer: Medicare Other | Admitting: Cardiovascular Disease

## 2013-04-05 ENCOUNTER — Ambulatory Visit (INDEPENDENT_AMBULATORY_CARE_PROVIDER_SITE_OTHER): Payer: Medicare Other

## 2013-04-05 ENCOUNTER — Ambulatory Visit (INDEPENDENT_AMBULATORY_CARE_PROVIDER_SITE_OTHER): Payer: Medicare Other | Admitting: Cardiovascular Disease

## 2013-04-05 ENCOUNTER — Encounter: Payer: Self-pay | Admitting: Cardiovascular Disease

## 2013-04-05 VITALS — BP 120/70 | HR 47 | Ht 61.0 in | Wt 132.0 lb

## 2013-04-05 DIAGNOSIS — I5031 Acute diastolic (congestive) heart failure: Secondary | ICD-10-CM

## 2013-04-05 DIAGNOSIS — I509 Heart failure, unspecified: Secondary | ICD-10-CM

## 2013-04-05 DIAGNOSIS — Z7901 Long term (current) use of anticoagulants: Secondary | ICD-10-CM

## 2013-04-05 DIAGNOSIS — I2510 Atherosclerotic heart disease of native coronary artery without angina pectoris: Secondary | ICD-10-CM

## 2013-04-05 DIAGNOSIS — I4891 Unspecified atrial fibrillation: Secondary | ICD-10-CM

## 2013-04-05 DIAGNOSIS — I25810 Atherosclerosis of coronary artery bypass graft(s) without angina pectoris: Secondary | ICD-10-CM

## 2013-04-05 DIAGNOSIS — E785 Hyperlipidemia, unspecified: Secondary | ICD-10-CM

## 2013-04-05 NOTE — Assessment & Plan Note (Signed)
Heart rate is extremely slow on today's visit. Likely has underlying sick sinus given problems with rate control in the past. We will hold her metoprolol and her digoxin. Her daughter will closely monitor her heart rate at call our office in the next week. This may be contributing to fatigue and gait instability.

## 2013-04-05 NOTE — Assessment & Plan Note (Signed)
We have suggested she continue on her simvastatin for now

## 2013-04-05 NOTE — Assessment & Plan Note (Signed)
No edema, shortness of breath or signs of fluid overload on today's visit

## 2013-04-05 NOTE — Patient Instructions (Addendum)
Please hold the digoxin and the metoprolol Please monitor your heart rate  Please call us if you have new issues that need to be addressed before your next appt.  Your physician wants you to follow-up in: 3 weeks to 1 month.

## 2013-04-05 NOTE — Progress Notes (Signed)
Patient ID: Haley Lee, female    DOB: September 09, 1925, 79 y.o.   MRN: [id number]  HPI Comments: Haley Lee is a very pleasant 79 year old woman with a history of coronary artery disease, bypass surgery, angioplasty, hypertension, recent mechanical fall several weeks ago who is not feeling well with shortness of breath, weakness and palpitations and presented to the hospital 08/01/2012, found to be in atrial fibrillation with RVR. She was started on diltiazem, anticoagulation and diuretic with mild improvement of her symptoms. Cardiac enzymes were negative.   She returns today and reports that she feels well very fatigued. She feels it is because she missed her B12 shot . Balance is unsteady. She is walking with a cane. Family feels she should walk with a walker . Occasional lightheadedness. She denies any significant shortness of breath, edema.  She does not check her heart rate or blood pressure at home on a regular basis  Echocardiogram showed ejection fraction 40-45%, mildly dilated left atrium, mild to moderate MR, severe TR with right ventricular systolic pressure 50-60 mm of mercury, mild to moderate aortic valve regurgitation  EKG today shows atrial fibrillation with rate 47 beats per minute, poor R-wave progression through the anterior precordial leads, left anterior fascicular block   Outpatient Encounter Prescriptions as of 04/05/2013  Medication Sig Dispense Refill  . aspirin 81 MG tablet Take 81 mg by mouth daily.      . Calcium Carbonate-Vitamin D (CALCIUM 600 + D PO) Take by mouth.      . Cholecalciferol (VITAMIN D-3 PO) Take 400 Units by mouth daily.      . digoxin (LANOXIN) 0.125 MG tablet Take 1 tablet (0.125 mg total) by mouth every other day.  45 tablet  3  . diltiazem (DILACOR XR) 120 MG 24 hr capsule Take 1 capsule (120 mg total) by mouth daily.  90 capsule  3  . FLUoxetine (PROZAC) 10 MG tablet Take 20 mg by mouth daily.       . furosemide (LASIX) 20 MG tablet Take 1 tablet (20  mg total) by mouth daily as needed.  30 tablet  3  . meloxicam (MOBIC) 15 MG tablet Take 15 mg by mouth daily.      . metoprolol (LOPRESSOR) 25 MG tablet Take 1 tablet (25 mg total) by mouth 2 (two) times daily.  180 tablet  3  . omeprazole (PRILOSEC) 20 MG capsule Take 20 mg by mouth daily.      . potassium chloride SA (K-DUR,KLOR-CON) 20 MEQ tablet Take 1 tablet (20 mEq total) by mouth daily as needed.  30 tablet  3  . simvastatin (ZOCOR) 40 MG tablet Take 40 mg by mouth every evening.      . warfarin (COUMADIN) 6 MG tablet Take 1 tablet daily as directed  34 tablet  3     Review of Systems  Constitutional: Positive for fatigue.  HENT: Negative.   Eyes: Negative.   Respiratory: Negative.   Gastrointestinal: Negative.   Musculoskeletal: Positive for gait problem.  Skin: Negative.   Neurological: Negative.   Psychiatric/Behavioral: Negative.   All other systems reviewed and are negative.    BP 120/70  Pulse 47  Ht 5\' 1"  (1.549 m)  Wt 132 lb (59.875 kg)  BMI 24.95 kg/m2  Physical Exam  Nursing note and vitals reviewed. Constitutional: She is oriented to person, place, and time. She appears well-developed and well-nourished.  HENT:  Head: Normocephalic.  Nose: Nose normal.  Mouth/Throat: Oropharynx is clear and moist.  Eyes: Conjunctivae are normal. Pupils are equal, round, and reactive to light.  Neck: Normal range of motion. Neck supple. No JVD present.  Cardiovascular: Normal rate, S1 normal, S2 normal, normal heart sounds and intact distal pulses.  An irregularly irregular rhythm present. Exam reveals no gallop and no friction rub.   No murmur heard. Trace ankle edema  Pulmonary/Chest: Effort normal and breath sounds normal. No respiratory distress. She has no wheezes. She has no rales. She exhibits no tenderness.  Abdominal: Soft. Bowel sounds are normal. She exhibits no distension. There is no tenderness.  Musculoskeletal: Normal range of motion. She exhibits no edema  and no tenderness.  Lymphadenopathy:    She has no cervical adenopathy.  Neurological: She is alert and oriented to person, place, and time. Coordination normal.  Skin: Skin is warm and dry. No rash noted. No erythema.  Psychiatric: She has a normal mood and affect. Her behavior is normal. Judgment and thought content normal.    Assessment and Plan

## 2013-04-05 NOTE — Assessment & Plan Note (Signed)
Currently with no symptoms of angina. No further workup at this time. Continue current medication regimen. 

## 2013-04-26 ENCOUNTER — Encounter: Payer: Self-pay | Admitting: Cardiovascular Disease

## 2013-04-26 ENCOUNTER — Ambulatory Visit (INDEPENDENT_AMBULATORY_CARE_PROVIDER_SITE_OTHER): Payer: Medicare Other

## 2013-04-26 ENCOUNTER — Ambulatory Visit (INDEPENDENT_AMBULATORY_CARE_PROVIDER_SITE_OTHER): Payer: Medicare Other | Admitting: Cardiovascular Disease

## 2013-04-26 VITALS — BP 139/75 | HR 94 | Ht 61.0 in | Wt 130.2 lb

## 2013-04-26 DIAGNOSIS — I5031 Acute diastolic (congestive) heart failure: Secondary | ICD-10-CM

## 2013-04-26 DIAGNOSIS — R609 Edema, unspecified: Secondary | ICD-10-CM

## 2013-04-26 DIAGNOSIS — I509 Heart failure, unspecified: Secondary | ICD-10-CM

## 2013-04-26 DIAGNOSIS — I4891 Unspecified atrial fibrillation: Secondary | ICD-10-CM

## 2013-04-26 DIAGNOSIS — I25810 Atherosclerosis of coronary artery bypass graft(s) without angina pectoris: Secondary | ICD-10-CM

## 2013-04-26 DIAGNOSIS — Z7901 Long term (current) use of anticoagulants: Secondary | ICD-10-CM

## 2013-04-26 MED ORDER — METOPROLOL TARTRATE 25 MG PO TABS: 25.0000 mg | ORAL_TABLET | Freq: Two times a day (BID) | ORAL | Status: AC

## 2013-04-26 NOTE — Assessment & Plan Note (Signed)
Concerned about continued pulmonary hypertension. Despite edema, she does have some scant crackles at the bases. Encouraged her to take Lasix on a more regular basis, possibly several times per week. She would prefer to take this with edema only.

## 2013-04-26 NOTE — Assessment & Plan Note (Signed)
Currently with no symptoms of angina. No further workup at this time. Continue current medication regimen. 

## 2013-04-26 NOTE — Assessment & Plan Note (Signed)
She takes Lasix when necessary for edema. No edema on today's visit.

## 2013-04-26 NOTE — Assessment & Plan Note (Signed)
Rate is improved but she is followed by palpitations and tachycardia. We will restart metoprolol 25 mg twice a day. If she has bradycardia, he has suggested she cut her metoprolol in half and take 12.5 g twice a day. We need therapeutic INR for least 4 weeks. If she like to continue with cardioversion, we will try amiodarone first before proceeding with cardioversion.

## 2013-04-26 NOTE — Patient Instructions (Addendum)
Please restart metoprolol 25 mg one pill twice a day If it runs slow again, Cut the metoprolol in 1/2 and take twice a day  Please call us if you have new issues that need to be addressed before your next appt.  Your physician wants you to follow-up in: 6 weeks

## 2013-04-26 NOTE — Progress Notes (Signed)
Patient ID: Haley Lee, female    DOB: 04/05/1925, 79 y.o.   MRN: [id number]  HPI Comments: Haley Lee is a very pleasant 79 year old woman with a history of coronary artery disease, bypass surgery, angioplasty, hypertension, recent mechanical fall who previously presented with shortness of breath, weakness and palpitations  to the hospital 08/01/2012, found to be in atrial fibrillation with RVR. She was started on diltiazem, anticoagulation and diuretic with mild improvement of her symptoms. Cardiac enzymes were negative.  She returns today for routine followup . On her last clinic visit, she had significant bradycardia, continued atrial fibrillation. Digoxin and metoprolol were held. She states that she feels better but now is bothered by more palpitations, particularly at nighttime . She continues to give out. She attributes this to the atrial fibrillation . She is now interested in possible cardioversion . She is unable to cook and do her high-level activities as she did previously.  She does not check her heart rate or blood pressure at home on a regular basis.   Echocardiogram showed ejection fraction 40-45%, mildly dilated left atrium, mild to moderate MR, severe TR with right ventricular systolic pressure 50-60 mm of mercury, mild to moderate aortic valve regurgitation  EKG today shows atrial fibrillation with rate 78 beats per minute, poor R-wave progression through the anterior precordial leads, left anterior fascicular block   Outpatient Encounter Prescriptions as of 04/26/2013  Medication Sig Dispense Refill  . aspirin 81 MG tablet Take 81 mg by mouth daily.      . Calcium Carbonate-Vitamin D (CALCIUM 600 + D PO) Take by mouth.      . Cholecalciferol (VITAMIN D-3 PO) Take 400 Units by mouth daily.      Marland Kitchen diltiazem (DILACOR XR) 120 MG 24 hr capsule Take 1 capsule (120 mg total) by mouth daily.  90 capsule  3  . FLUoxetine (PROZAC) 10 MG tablet Take 20 mg by mouth daily.       .  furosemide (LASIX) 20 MG tablet Take 1 tablet (20 mg total) by mouth daily as needed.  30 tablet  3  . meloxicam (MOBIC) 15 MG tablet Take 15 mg by mouth daily.      Marland Kitchen omeprazole (PRILOSEC) 20 MG capsule Take 20 mg by mouth daily.      . potassium chloride SA (K-DUR,KLOR-CON) 20 MEQ tablet Take 1 tablet (20 mEq total) by mouth daily as needed.  30 tablet  3  . simvastatin (ZOCOR) 40 MG tablet Take 40 mg by mouth every evening.      . warfarin (COUMADIN) 6 MG tablet Take 1 tablet daily as directed  34 tablet  3   No facility-administered encounter medications on file as of 04/26/2013.      Review of Systems  Constitutional: Positive for fatigue.  HENT: Negative.   Eyes: Negative.   Respiratory: Negative.   Cardiovascular: Positive for palpitations.  Gastrointestinal: Negative.   Musculoskeletal: Positive for gait problem.  Skin: Negative.   Neurological: Negative.   Psychiatric/Behavioral: Negative.   All other systems reviewed and are negative.    BP 139/75  Pulse 94  Ht 5\' 1"  (1.549 m)  Wt 130 lb 4 oz (59.081 kg)  BMI 24.62 kg/m2  Physical Exam  Nursing note and vitals reviewed. Constitutional: She is oriented to person, place, and time. She appears well-developed and well-nourished.  HENT:  Head: Normocephalic.  Nose: Nose normal.  Mouth/Throat: Oropharynx is clear and moist.  Eyes: Conjunctivae are normal. Pupils are equal,  round, and reactive to light.  Neck: Normal range of motion. Neck supple. No JVD present.  Cardiovascular: Normal rate, S1 normal, S2 normal, normal heart sounds and intact distal pulses.  An irregularly irregular rhythm present. Exam reveals no gallop and no friction rub.   No murmur heard. Trace ankle edema  Pulmonary/Chest: Effort normal and breath sounds normal. No respiratory distress. She has no wheezes. She has no rales. She exhibits no tenderness.  Abdominal: Soft. Bowel sounds are normal. She exhibits no distension. There is no tenderness.   Musculoskeletal: Normal range of motion. She exhibits no edema and no tenderness.  Lymphadenopathy:    She has no cervical adenopathy.  Neurological: She is alert and oriented to person, place, and time. Coordination normal.  Skin: Skin is warm and dry. No rash noted. No erythema.  Psychiatric: She has a normal mood and affect. Her behavior is normal. Judgment and thought content normal.    Assessment and Plan

## 2013-04-27 ENCOUNTER — Encounter: Payer: Self-pay | Admitting: Cardiovascular Disease

## 2013-05-01 ENCOUNTER — Telehealth: Payer: Self-pay

## 2013-05-01 NOTE — Telephone Encounter (Signed)
States pt is very weak, states HR is fluctuating in the 80-60's . Not sure if she should cut her medication(jMetroprolol, she thinks) in half or not. Please advise

## 2013-05-01 NOTE — Telephone Encounter (Signed)
BP and heart rate do look good. Would try one in Am perhaps

## 2013-05-01 NOTE — Telephone Encounter (Signed)
Pt's dtr says pt was restarted on metoprolol 25 mg BID last week for atrial fib Says since resuming this medication pt has been "unable to put one foot in front of the other" and is so weak BP=121/72,125/87,126/68,119/76,138/73,132/86 HR=84,88,64,65,, 86, 60 I explained, based on VS, these are ok but if pt is feeling weaker on metoprolol 25 BID, she can try decreasing dose to 12.5 mg BID to see if this helps her symptoms dtr says pill too small to cut in half and is just going to try giving her 1 tablet at HS I tried to explain this medication is a BID med and should try 12.5 mg BID but dtr is hesitant and wants to try 1 tablet qhs first I will make Dr. Mariah Milling aware

## 2013-05-02 ENCOUNTER — Ambulatory Visit: Payer: Self-pay | Admitting: Internal Medicine

## 2013-05-02 NOTE — Telephone Encounter (Signed)
Will give this med change a few days then will call pt

## 2013-05-04 ENCOUNTER — Emergency Department: Payer: Self-pay | Admitting: Emergency Medicine

## 2013-05-04 LAB — PROTIME-INR: Prothrombin Time: 24.7 secs — ABNORMAL HIGH (ref 11.5–14.7)

## 2013-05-04 LAB — CBC
MCV: 78 fL — ABNORMAL LOW (ref 80–100)
Platelet: 177 10*3/uL (ref 150–440)
RBC: 4.71 10*6/uL (ref 3.80–5.20)
RDW: 18 % — ABNORMAL HIGH (ref 11.5–14.5)

## 2013-05-04 NOTE — Telephone Encounter (Signed)
Pt's dtr says pt's BPs and HRs are still fluctuating Still WNL but pt still c/o generalized weakness She has appt with PCP today and will address with him as well  I explained it could be the atrial fib making her feel bad but I will share info with Dr. Mariah Milling and call her back Understanding verb

## 2013-05-06 ENCOUNTER — Emergency Department: Payer: Self-pay | Admitting: Emergency Medicine

## 2013-05-06 LAB — URINALYSIS, COMPLETE
Bilirubin,UR: NEGATIVE
Hyaline Cast: 1
Ketone: NEGATIVE
Ph: 5 (ref 4.5–8.0)
Protein: 30
RBC,UR: <1 /HPF (ref 0–5)
Specific Gravity: 1.023 (ref 1.003–1.030)
WBC UR: 1 /HPF (ref 0–5)

## 2013-05-08 LAB — URINE CULTURE

## 2013-05-09 ENCOUNTER — Telehealth: Payer: Self-pay

## 2013-05-09 NOTE — Telephone Encounter (Signed)
Spoke with pt daughter, pt fell Thursday and went to ER, CT was done and was negative. They instructed pt at ER to hold Fridays coumadin dose and return on Saturday for repeat CT. On Saturday CT was negative and pt was instructed to restart coumadin.  UA was done at ER daughter states she was called today and inform pt needs to start Cipro. Daughter states pt will start ABX on 05/10/13, thus appt s/c for Monday.

## 2013-05-09 NOTE — Telephone Encounter (Signed)
Please advise 

## 2013-05-09 NOTE — Telephone Encounter (Signed)
Pt daughter states pt fell, and had to go to ED, pt has UTI, and wanted to call in Cipro to tx it, wanted to make sure it is ok for her to take this with her coumadin.Please advise

## 2013-05-10 NOTE — Telephone Encounter (Signed)
Called spoke with pt's daughter.  She states pt fell on 05/04/13 and hit head.  Large hematoma present went o primary MD and then to ED for CT scan to assess.  No evidence on CT of acute intercranial hemorrage.  Pt was advised to hold Coumadin on 05/05/13 since she had already taken on 05/04/13 and to return for repeat CT scan on 05/06/13 at which time they resumed pt's Coumadin.  Advised of below recommendations per Dr Mariah Milling.  Pt starting on Cipro today for UTI.  Appointment made for INR on 05/15/13, after which will need weekly INR for possible DCCV. [phone] Linda's cell

## 2013-05-10 NOTE — Telephone Encounter (Signed)
She has indicated she would like a cardioversion This can be arranged but she needs therapeutic INR for 4 weeks Perhaps we can bring her in for INR check confirm she is to or greater And confirm again for a 4 week period. At that point we could proceed with plans for cardioversion. I would like to try amiodarone first before DCCV

## 2013-05-10 NOTE — Telephone Encounter (Signed)
Dtr, linda, informed Understanding verb Went to pcp yesterday Hr=70, bp=wnl Will address Monday when she comes for INR

## 2013-05-11 ENCOUNTER — Inpatient Hospital Stay: Payer: Self-pay | Admitting: Orthopedic Surgery

## 2013-05-11 LAB — COMPREHENSIVE METABOLIC PANEL
Alkaline Phosphatase: 150 U/L — ABNORMAL HIGH (ref 50–136)
Anion Gap: 5 — ABNORMAL LOW (ref 7–16)
BUN: 25 mg/dL — ABNORMAL HIGH (ref 7–18)
Bilirubin,Total: 1.4 mg/dL — ABNORMAL HIGH (ref 0.2–1.0)
Calcium, Total: 8.7 mg/dL (ref 8.5–10.1)
Co2: 28 mmol/L (ref 21–32)
SGOT(AST): 59 U/L — ABNORMAL HIGH (ref 15–37)
Sodium: 138 mmol/L (ref 136–145)

## 2013-05-11 LAB — URINALYSIS, COMPLETE
Bilirubin,UR: NEGATIVE
Leukocyte Esterase: NEGATIVE
Ph: 5 (ref 4.5–8.0)
RBC,UR: 1 /HPF (ref 0–5)
Specific Gravity: 1.021 (ref 1.003–1.030)
WBC UR: <1 /HPF (ref 0–5)

## 2013-05-11 LAB — TROPONIN I: Troponin-I: <0.02 ng/mL

## 2013-05-11 LAB — PROTIME-INR: INR: 2.3

## 2013-05-11 LAB — PRO B NATRIURETIC PEPTIDE: B-Type Natriuretic Peptide: 5753 pg/mL — ABNORMAL HIGH (ref 0–450)

## 2013-05-11 LAB — CK TOTAL AND CKMB (NOT AT ARMC)
CK, Total: 326 U/L — ABNORMAL HIGH (ref 21–215)
CK-MB: 3.7 ng/mL — ABNORMAL HIGH (ref 0.5–3.6)

## 2013-05-11 LAB — CBC
HGB: 11.4 g/dL — ABNORMAL LOW (ref 12.0–16.0)
MCH: 24.5 pg — ABNORMAL LOW (ref 26.0–34.0)
Platelet: 149 10*3/uL — ABNORMAL LOW (ref 150–440)

## 2013-05-12 DIAGNOSIS — Z01810 Encounter for preprocedural cardiovascular examination: Secondary | ICD-10-CM

## 2013-05-12 LAB — BASIC METABOLIC PANEL
BUN: 19 mg/dL — ABNORMAL HIGH (ref 7–18)
Calcium, Total: 8.8 mg/dL (ref 8.5–10.1)
Chloride: 101 mmol/L (ref 98–107)
Co2: 27 mmol/L (ref 21–32)
Creatinine: 0.88 mg/dL (ref 0.60–1.30)
EGFR (African American): >60
EGFR (Non-African Amer.): 59 — ABNORMAL LOW
Glucose: 114 mg/dL — ABNORMAL HIGH (ref 65–99)
Osmolality: 275 (ref 275–301)

## 2013-05-12 LAB — CBC WITH DIFFERENTIAL/PLATELET
Basophil %: 0.1 %
Eosinophil #: 0 10*3/uL (ref 0.0–0.7)
HCT: 35.8 % (ref 35.0–47.0)
HGB: 11.3 g/dL — ABNORMAL LOW (ref 12.0–16.0)
Lymphocyte #: 1.5 10*3/uL (ref 1.0–3.6)
Lymphocyte %: 17.7 %
MCH: 24.6 pg — ABNORMAL LOW (ref 26.0–34.0)
MCHC: 31.5 g/dL — ABNORMAL LOW (ref 32.0–36.0)
MCV: 78 fL — ABNORMAL LOW (ref 80–100)
Monocyte #: 1.7 x10 3/mm — ABNORMAL HIGH (ref 0.2–0.9)
Monocyte %: 20.2 %
Neutrophil #: 5.1 10*3/uL (ref 1.4–6.5)
Neutrophil %: 62 %
RDW: 17.9 % — ABNORMAL HIGH (ref 11.5–14.5)

## 2013-05-12 LAB — PROTIME-INR: Prothrombin Time: 23.3 secs — ABNORMAL HIGH (ref 11.5–14.7)

## 2013-05-13 LAB — PROTIME-INR
Prothrombin Time: 21 secs — ABNORMAL HIGH (ref 11.5–14.7)
Prothrombin Time: 15.4 secs — ABNORMAL HIGH (ref 11.5–14.7)

## 2013-05-14 DIAGNOSIS — I4891 Unspecified atrial fibrillation: Secondary | ICD-10-CM

## 2013-05-14 LAB — BASIC METABOLIC PANEL
Anion Gap: 4 — ABNORMAL LOW (ref 7–16)
BUN: 29 mg/dL — ABNORMAL HIGH (ref 7–18)
Calcium, Total: 8.2 mg/dL — ABNORMAL LOW (ref 8.5–10.1)
Chloride: 100 mmol/L (ref 98–107)
Co2: 32 mmol/L (ref 21–32)
Glucose: 160 mg/dL — ABNORMAL HIGH (ref 65–99)
Potassium: 4.6 mmol/L (ref 3.5–5.1)
Sodium: 136 mmol/L (ref 136–145)

## 2013-05-14 LAB — PLATELET COUNT: Platelet: 121 10*3/uL — ABNORMAL LOW (ref 150–440)

## 2013-05-14 LAB — PROTIME-INR: INR: 1.2

## 2013-05-15 LAB — BASIC METABOLIC PANEL
Anion Gap: 9 (ref 7–16)
BUN: 42 mg/dL — ABNORMAL HIGH (ref 7–18)
Creatinine: 1.84 mg/dL — ABNORMAL HIGH (ref 0.60–1.30)
Glucose: 140 mg/dL — ABNORMAL HIGH (ref 65–99)
Osmolality: 281 (ref 275–301)
Potassium: 5 mmol/L (ref 3.5–5.1)
Sodium: 134 mmol/L — ABNORMAL LOW (ref 136–145)

## 2013-05-16 LAB — POTASSIUM: Potassium: 5.3 mmol/L — ABNORMAL HIGH (ref 3.5–5.1)

## 2013-05-16 LAB — BASIC METABOLIC PANEL
Calcium, Total: 8.8 mg/dL (ref 8.5–10.1)
Chloride: 99 mmol/L (ref 98–107)
EGFR (Non-African Amer.): 36 — ABNORMAL LOW
Glucose: 116 mg/dL — ABNORMAL HIGH (ref 65–99)

## 2013-05-19 ENCOUNTER — Inpatient Hospital Stay: Payer: Self-pay | Admitting: Specialist

## 2013-05-19 LAB — URINALYSIS, COMPLETE
Bacteria: NONE SEEN
Bilirubin,UR: NEGATIVE
Blood: NEGATIVE
Hyaline Cast: 7
Leukocyte Esterase: NEGATIVE
Ph: 5 (ref 4.5–8.0)
Protein: NEGATIVE
Specific Gravity: 1.02 (ref 1.003–1.030)

## 2013-05-19 LAB — MAGNESIUM: Magnesium: 2.2 mg/dL

## 2013-05-19 LAB — COMPREHENSIVE METABOLIC PANEL
Albumin: 2.6 g/dL — ABNORMAL LOW (ref 3.4–5.0)
Anion Gap: 7 (ref 7–16)
BUN: 62 mg/dL — ABNORMAL HIGH (ref 7–18)
Calcium, Total: 8.2 mg/dL — ABNORMAL LOW (ref 8.5–10.1)
Chloride: 100 mmol/L (ref 98–107)
Co2: 27 mmol/L (ref 21–32)
Creatinine: 1.31 mg/dL — ABNORMAL HIGH (ref 0.60–1.30)
EGFR (African American): 43 — ABNORMAL LOW
Glucose: 112 mg/dL — ABNORMAL HIGH (ref 65–99)
Osmolality: 287 (ref 275–301)
SGOT(AST): 96 U/L — ABNORMAL HIGH (ref 15–37)
SGPT (ALT): 58 U/L (ref 12–78)
Sodium: 134 mmol/L — ABNORMAL LOW (ref 136–145)
Total Protein: 5.8 g/dL — ABNORMAL LOW (ref 6.4–8.2)

## 2013-05-19 LAB — CBC
HCT: 28.3 % — ABNORMAL LOW (ref 35.0–47.0)
HGB: 8.5 g/dL — ABNORMAL LOW (ref 12.0–16.0)
MCH: 25.2 pg — ABNORMAL LOW (ref 26.0–34.0)
Platelet: 215 10*3/uL (ref 150–440)
RDW: 20.5 % — ABNORMAL HIGH (ref 11.5–14.5)

## 2013-05-19 LAB — PHOSPHORUS: Phosphorus: 3.9 mg/dL (ref 2.5–4.9)

## 2013-05-19 LAB — PROTIME-INR
INR: 1.3
Prothrombin Time: 16 secs — ABNORMAL HIGH (ref 11.5–14.7)

## 2013-05-19 LAB — TROPONIN I: Troponin-I: 1.2 ng/mL — ABNORMAL HIGH

## 2013-05-21 LAB — URINE CULTURE

## 2013-05-24 LAB — CULTURE, BLOOD (SINGLE)

## 2013-06-02 ENCOUNTER — Ambulatory Visit: Payer: Self-pay | Admitting: Internal Medicine

## 2013-06-02 DEATH — deceased

## 2013-06-07 ENCOUNTER — Ambulatory Visit: Payer: Medicare Other | Admitting: Cardiovascular Disease

## 2013-07-12 ENCOUNTER — Ambulatory Visit: Payer: Self-pay | Admitting: Cardiovascular Disease

## 2013-07-12 DIAGNOSIS — I4891 Unspecified atrial fibrillation: Secondary | ICD-10-CM

## 2013-07-12 DIAGNOSIS — Z7901 Long term (current) use of anticoagulants: Secondary | ICD-10-CM

## 2014-07-09 IMAGING — CR PELVIS - 1-2 VIEW
1 series · 2 of 2 positions shown · non-contrast
Comparison: none

REASON FOR EXAM: pain following trauma
COMMENTS:

PROCEDURE:     DXR - DXR PELVIS AP ONLY  - May 11, 2013  [DATE]
RESULT:     There is a proximal right femoral fracture that appears to be in
the femoral neck. The pelvis appears intact the left hip appears to be
unremarkable.

[Series 1: ap · 0.17mm/px · 2 of 2 slices shown]
[im 1/2]
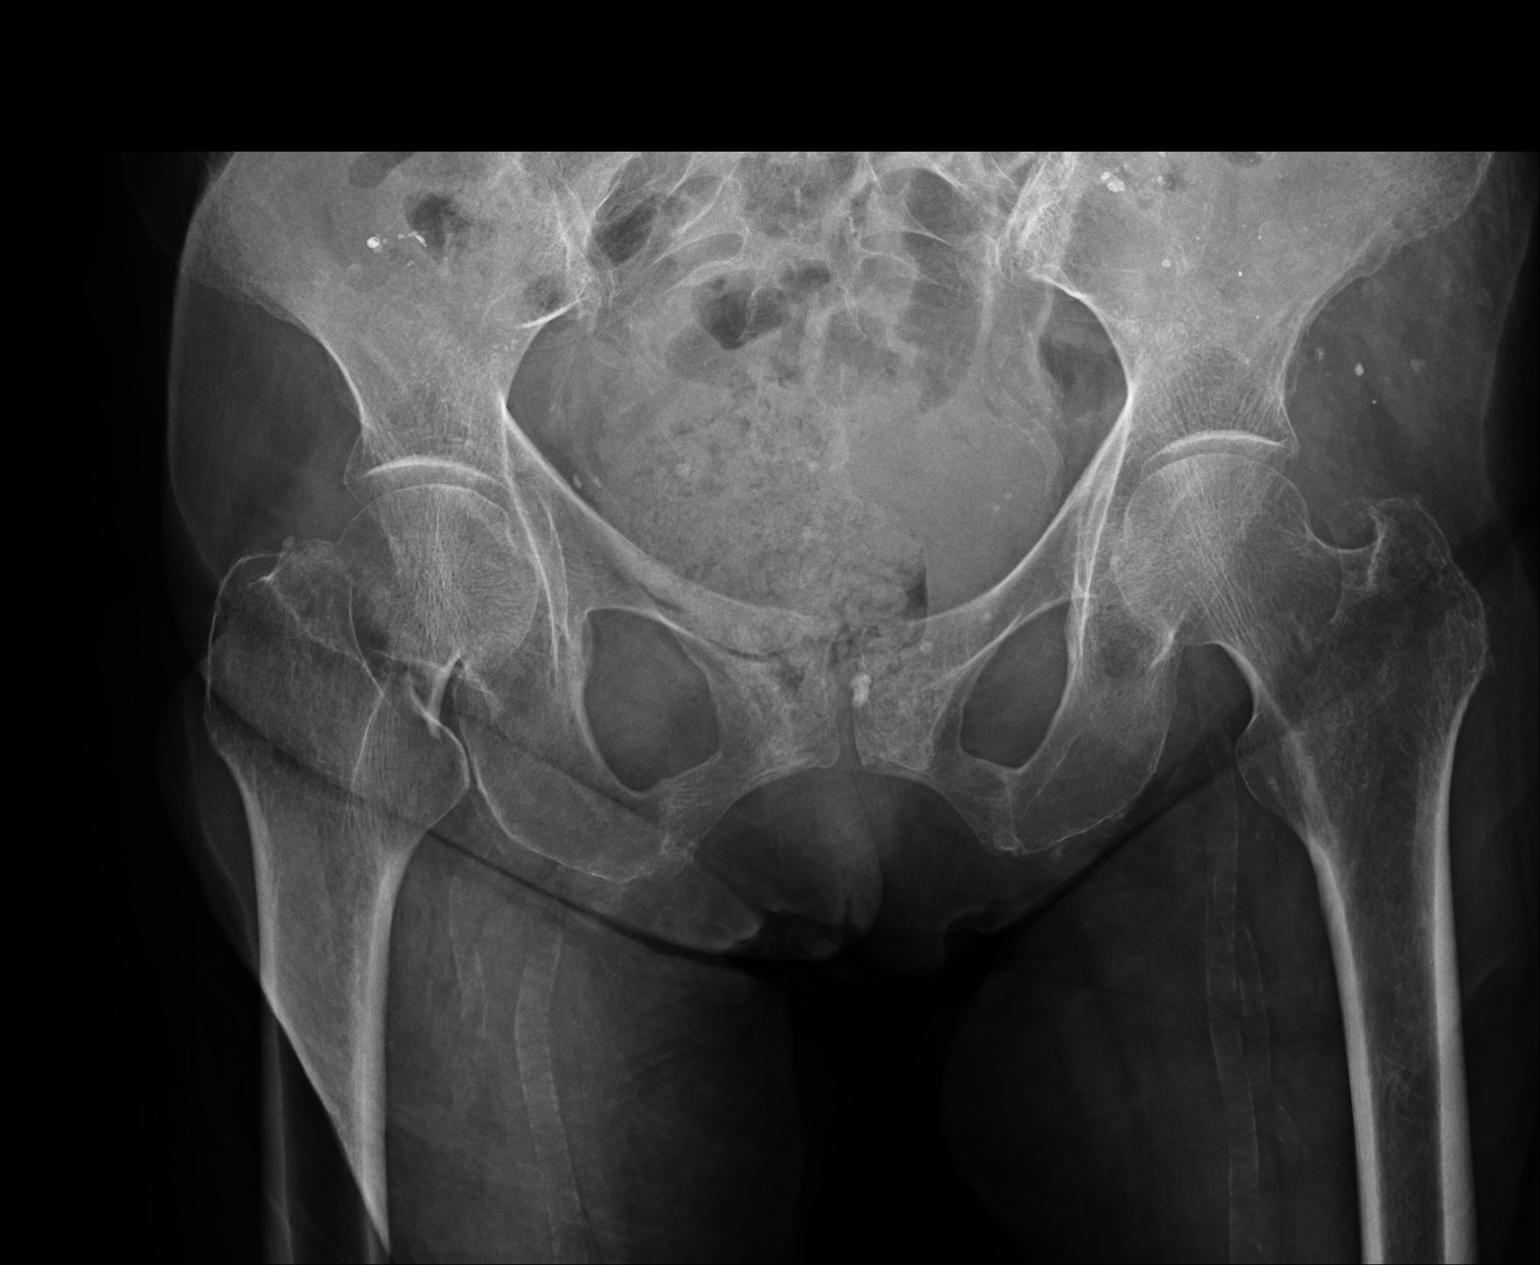
[im 2/2]
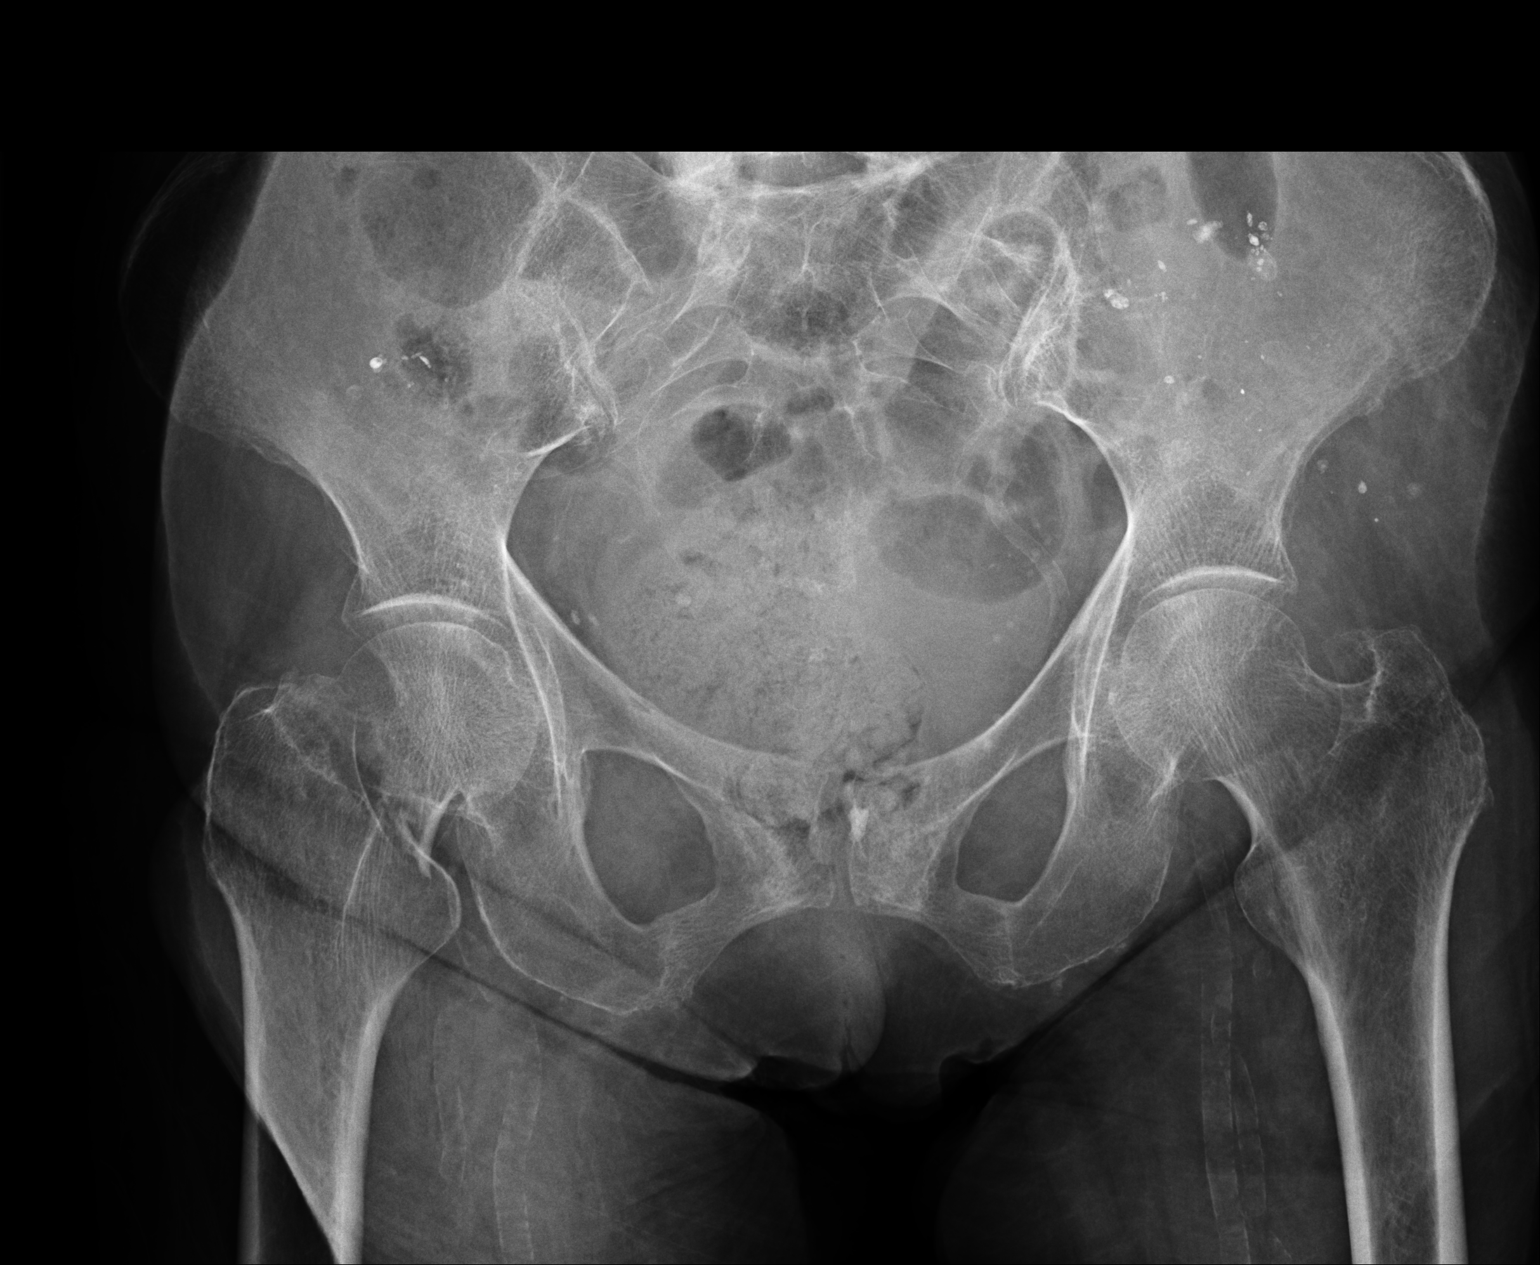

[2 of 2 positions shown; findings below may reference images not displayed]

IMPRESSION: Proximal right femur fracture in the femoral neck.

[REDACTED]

## 2014-07-09 IMAGING — CR DG CHEST 2V
1 series · 2 of 2 positions shown · non-contrast
Comparison: none

REASON FOR EXAM: generalized weakness
COMMENTS:   May transport without cardiac monitor

[Series 1: ap · 0.17mm/px · 2 of 2 slices shown]
[im 1/2]
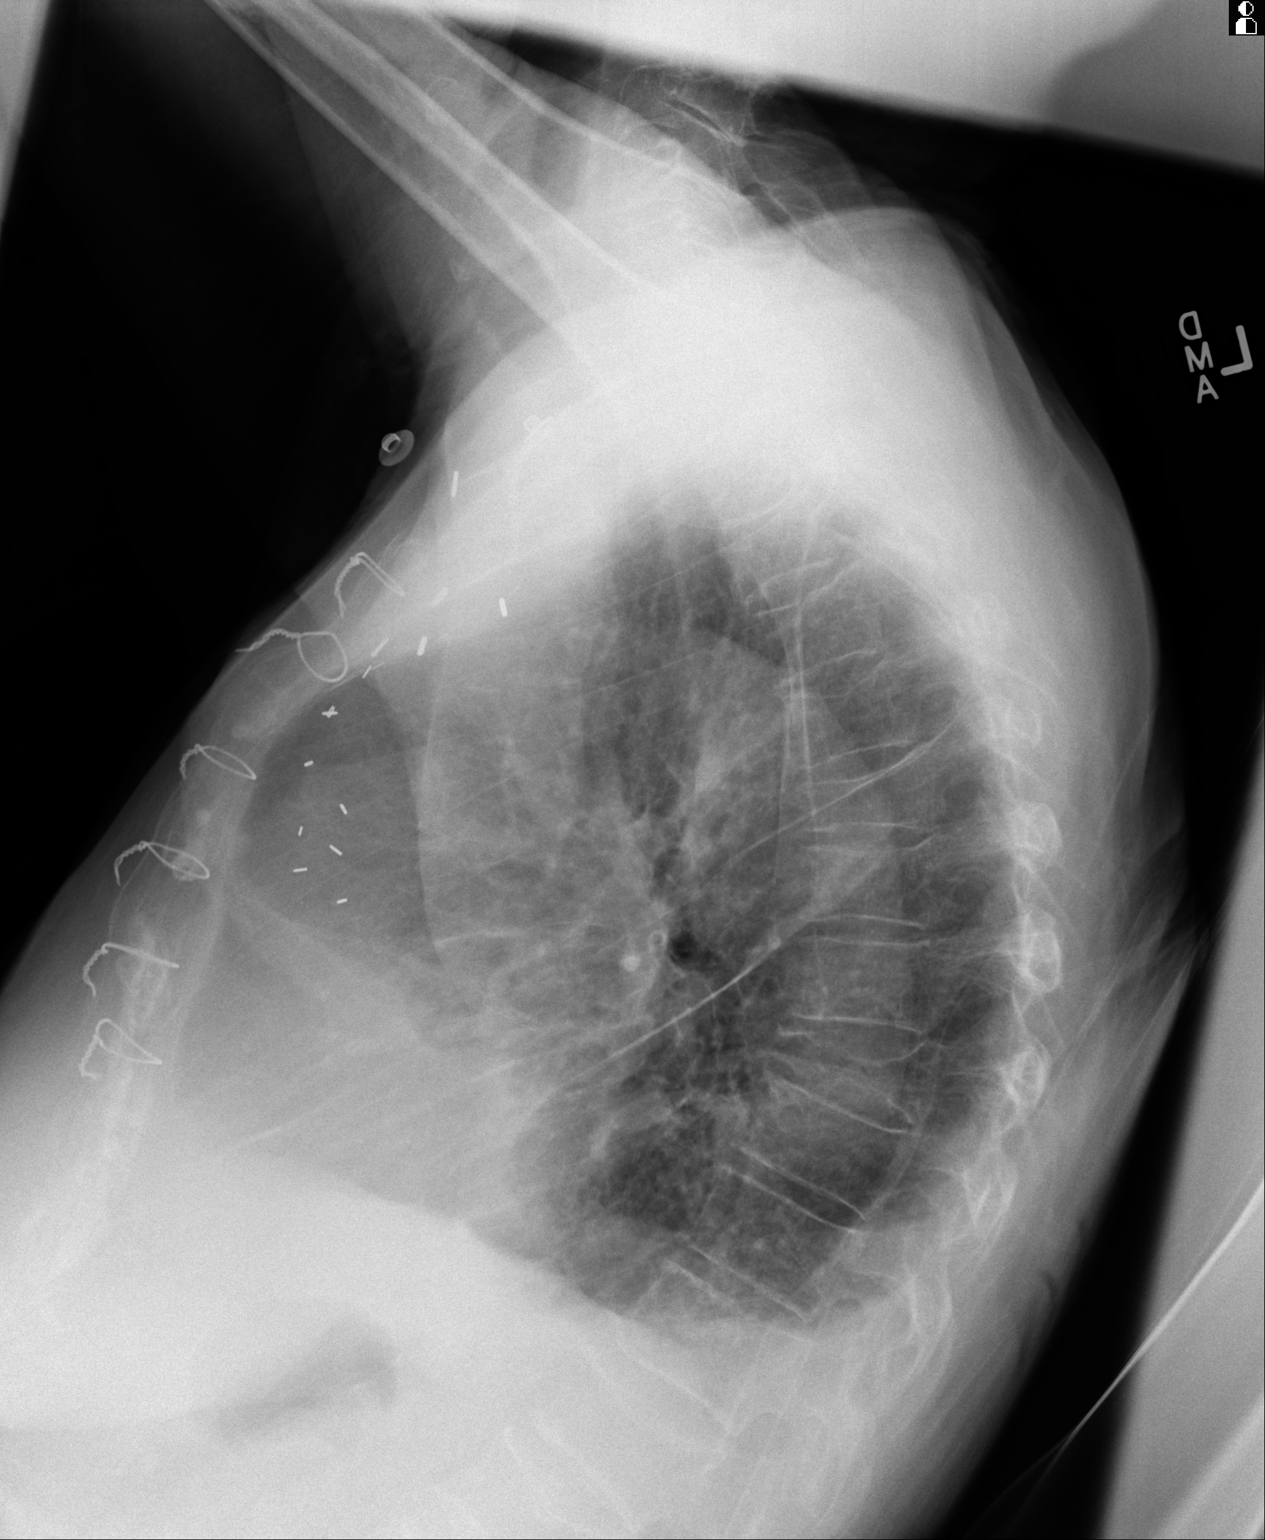
[im 2/2]
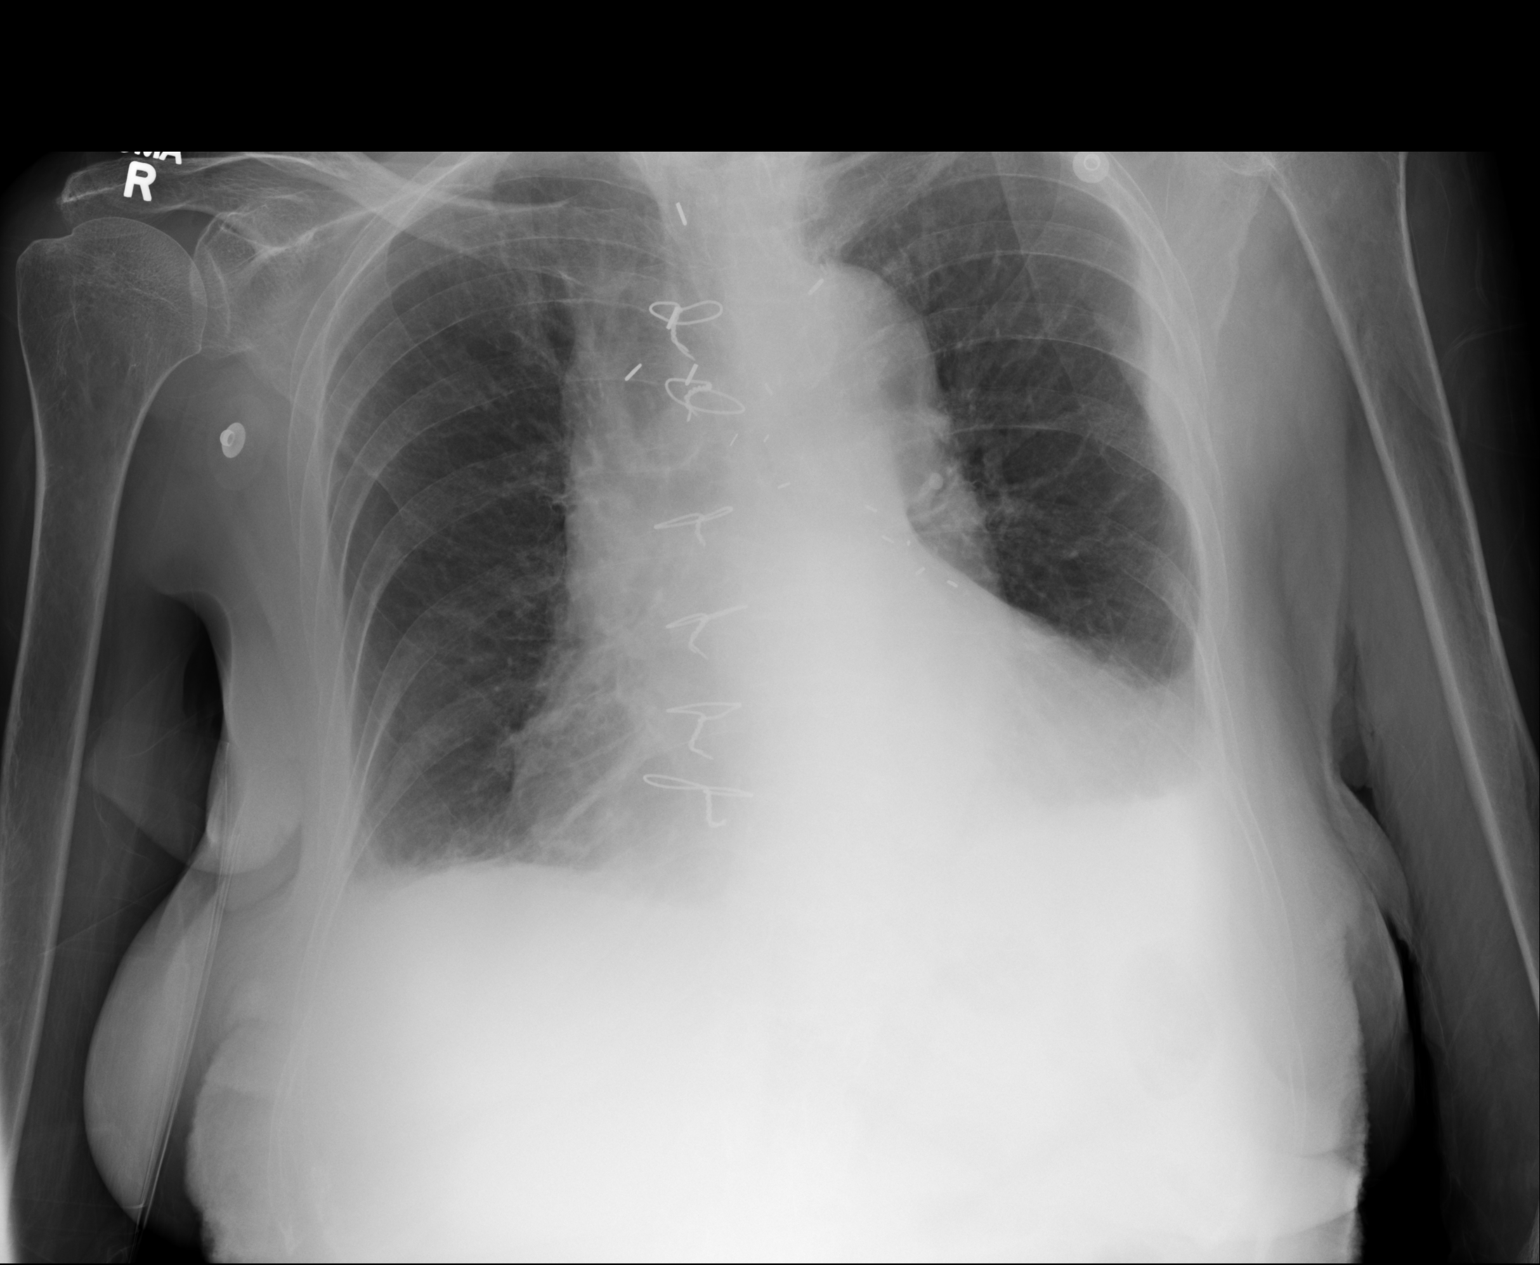

[2 of 2 positions shown; findings below may reference images not displayed]

PROCEDURE:     DXR - DXR CHEST PA (OR AP) AND LATERAL  - May 11, 2013  [DATE]

RESULT:     Comparison is made to an exam of 09/17/2012. There are small
bilateral pleural effusions. Cardiac silhouette is enlarged with sternotomy
wires present. There is no definite edema, pneumothorax, mass or infiltrate.
The bones are osteopenic. There is a compression fracture within the
thoracic spine which is likely chronic given the appearance on the previous
study.
IMPRESSION: 1. Cardiomegaly with small pleural effusions bilaterally.

[REDACTED]

## 2014-07-09 IMAGING — CR DG FEMUR 2V*R*
1 series · 4 of 4 positions shown · non-contrast
Comparison: none

REASON FOR EXAM: pain following trauma
COMMENTS:

PROCEDURE:     DXR - DXR FEMUR RIGHT  - May 11, 2013  [DATE]
RESULT:     There is a fracture in the right femoral neck. The remaining
portions of the right femur appear intact. Degenerative changes are evident
in the right knee. Prominent atherosclerotic changes are present.

[Series 1: ap · 0.17mm/px · 4 of 4 slices shown]
[im 1/4]
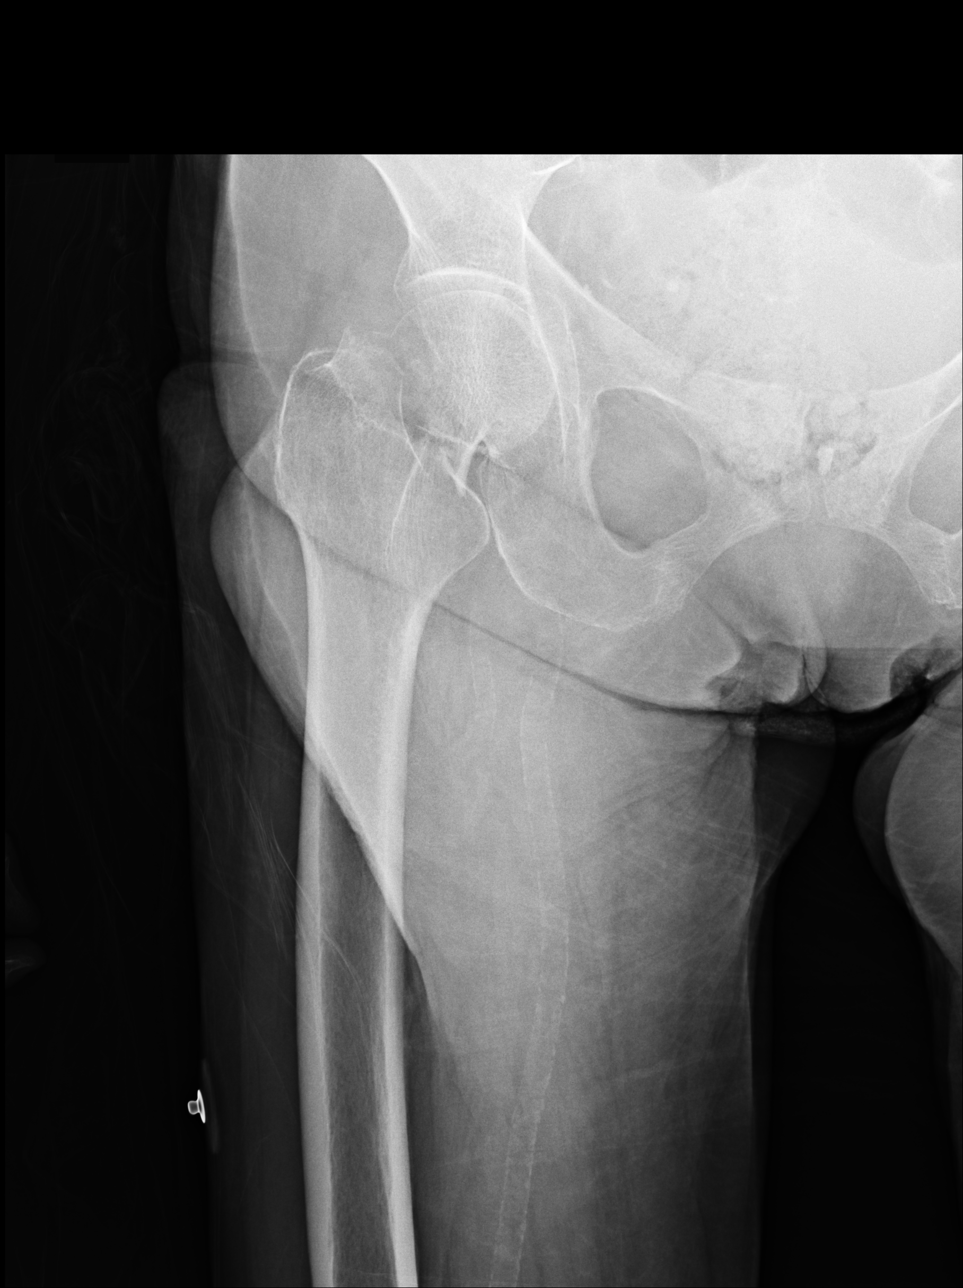
[im 2/4]
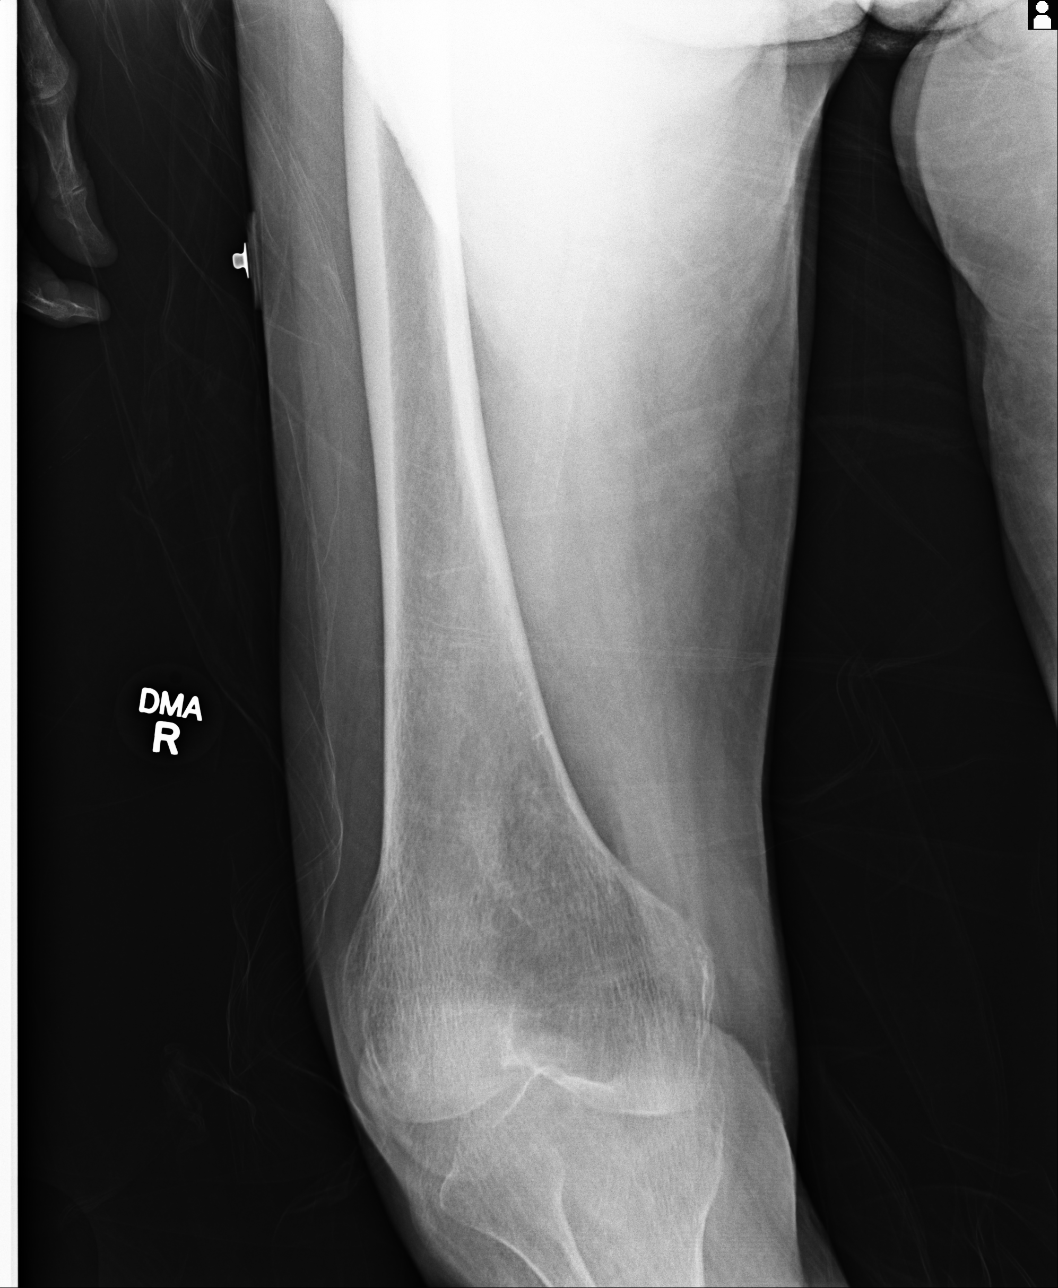
[im 3/4]
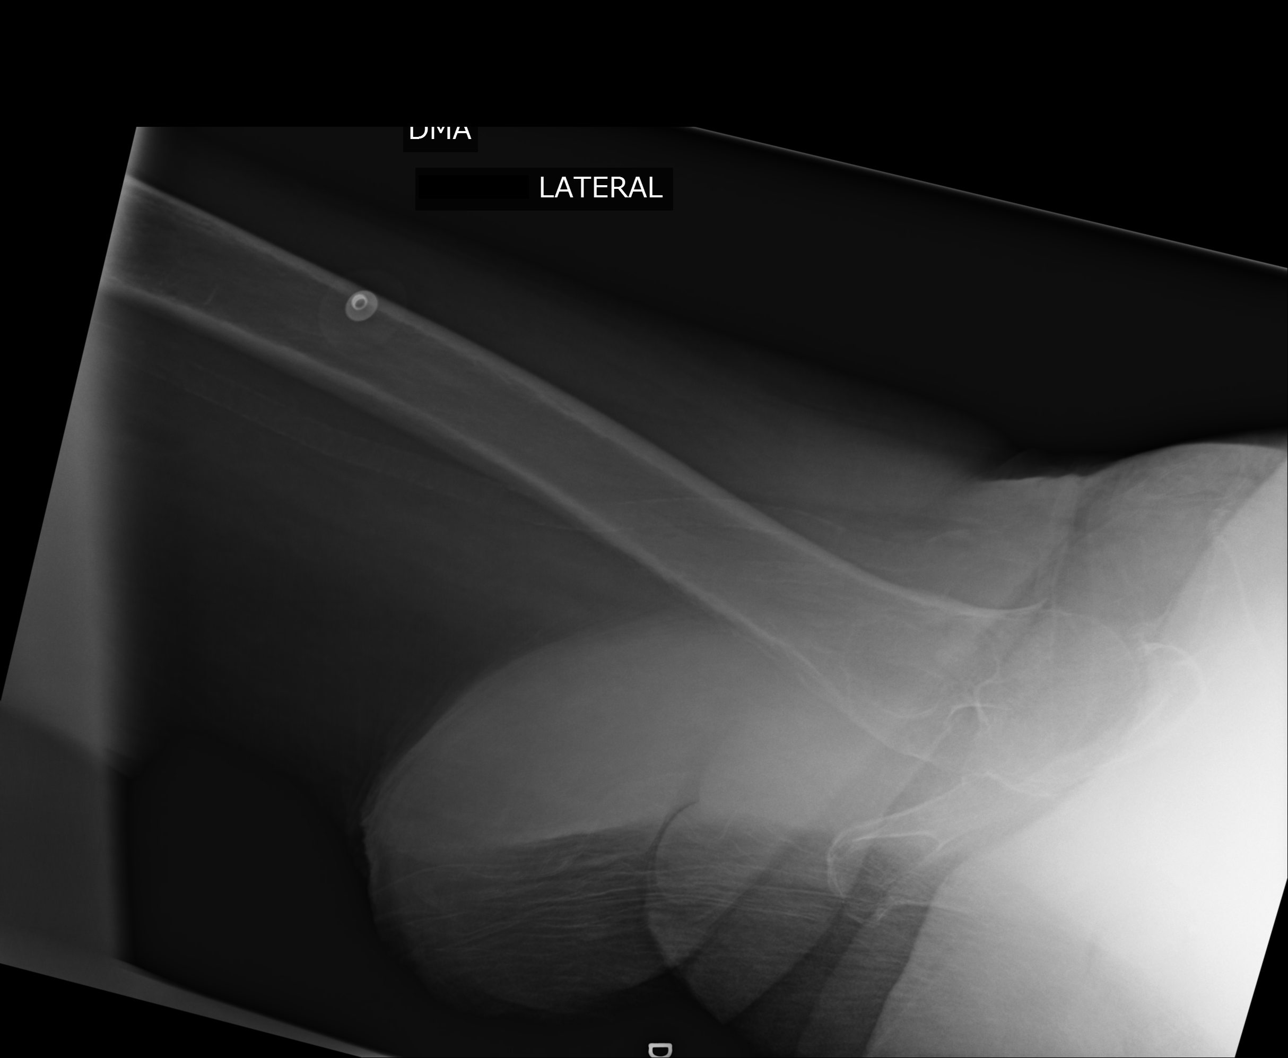
[im 4/4]
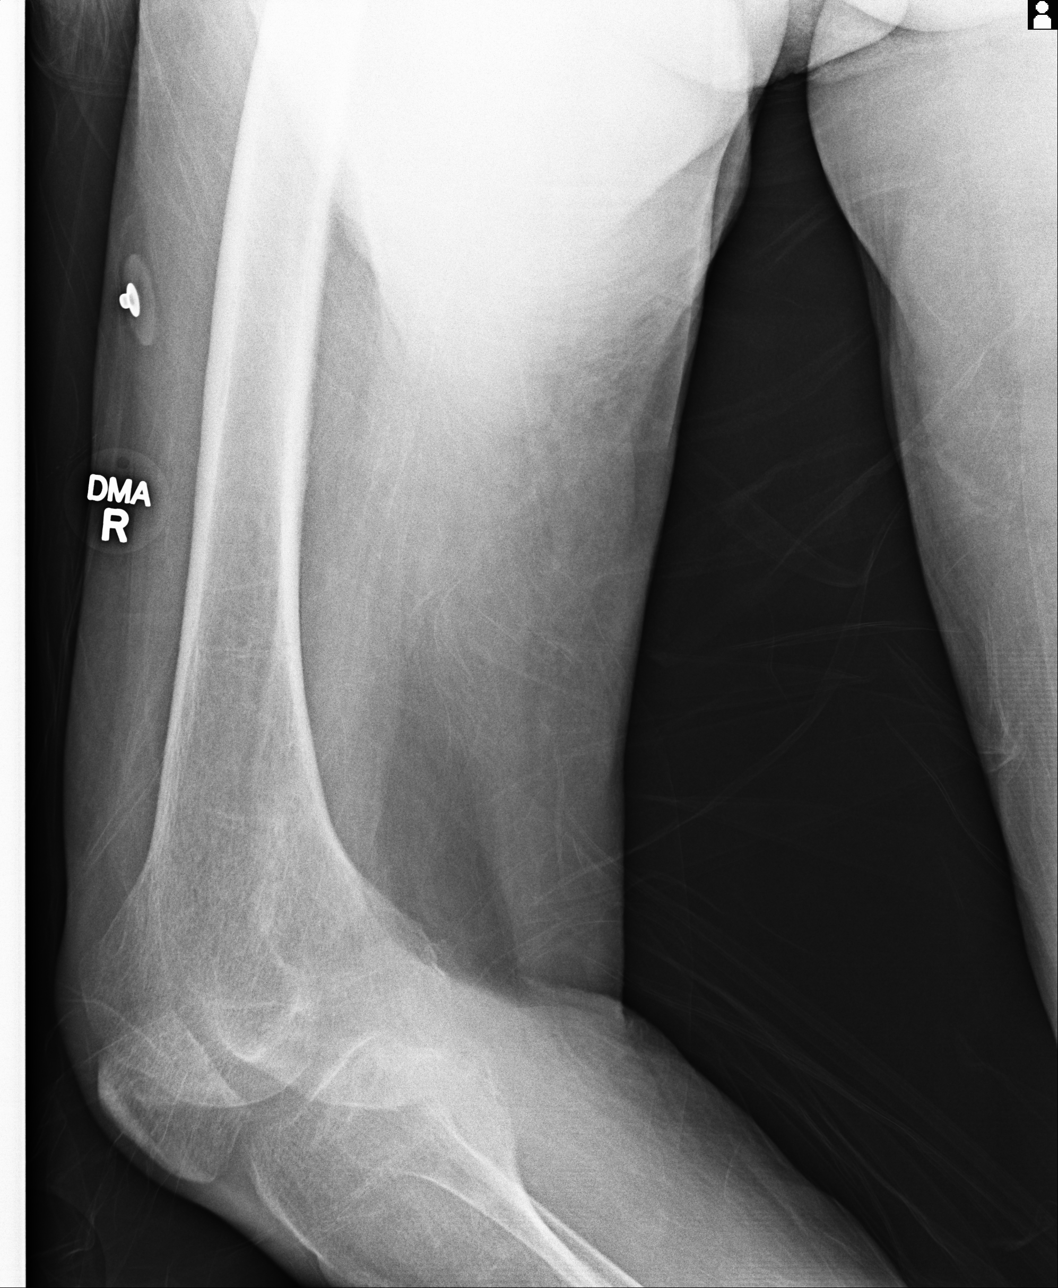

[4 of 4 positions shown; findings below may reference images not displayed]

IMPRESSION: Please see above.

[REDACTED]

## 2014-07-09 IMAGING — CR DG KNEE COMPLETE 4+V*R*
1 series · 6 of 6 positions shown · non-contrast
Comparison: none

REASON FOR EXAM: pain following trauma
COMMENTS:

PROCEDURE:     DXR - DXR KNEE RT COMP WITH OBLIQUES  - May 11, 2013  [DATE]
RESULT:     Images of the right knee demonstrate fairly significant
osteopenia with prominent atherosclerotic calcification. The patient was
unable to fully extend the knee. No fracture is visible.

[Series 1: ap · 0.17mm/px · 6 of 6 slices shown]
[im 1/6]
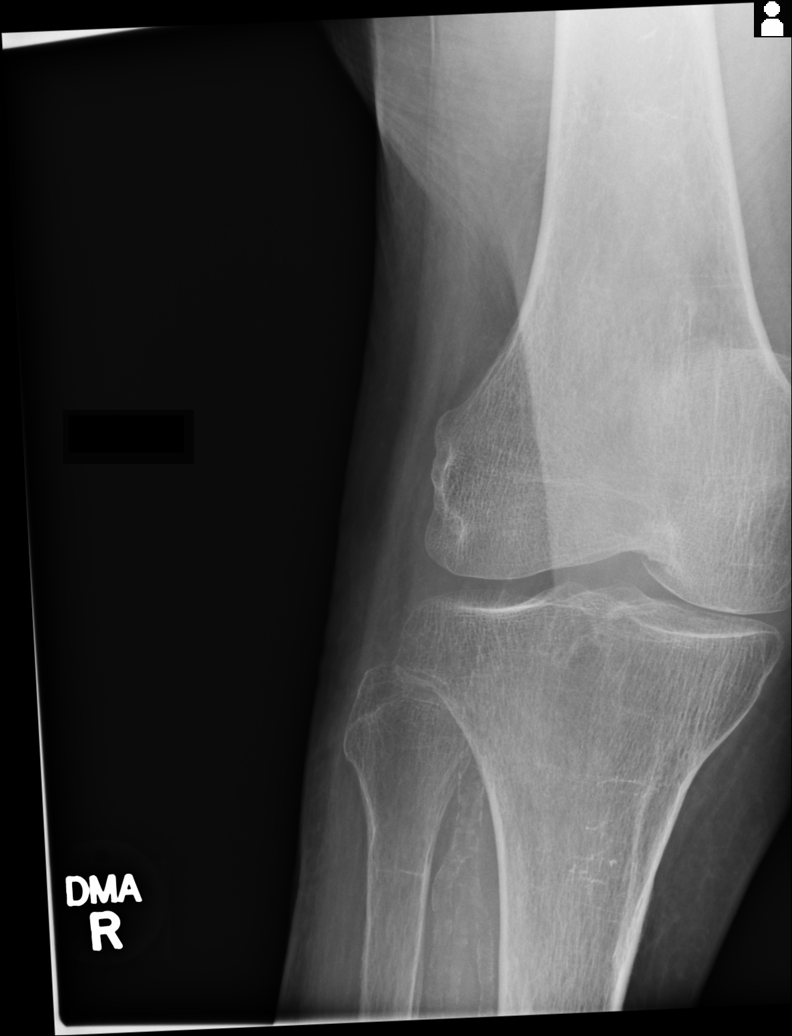
[im 2/6]
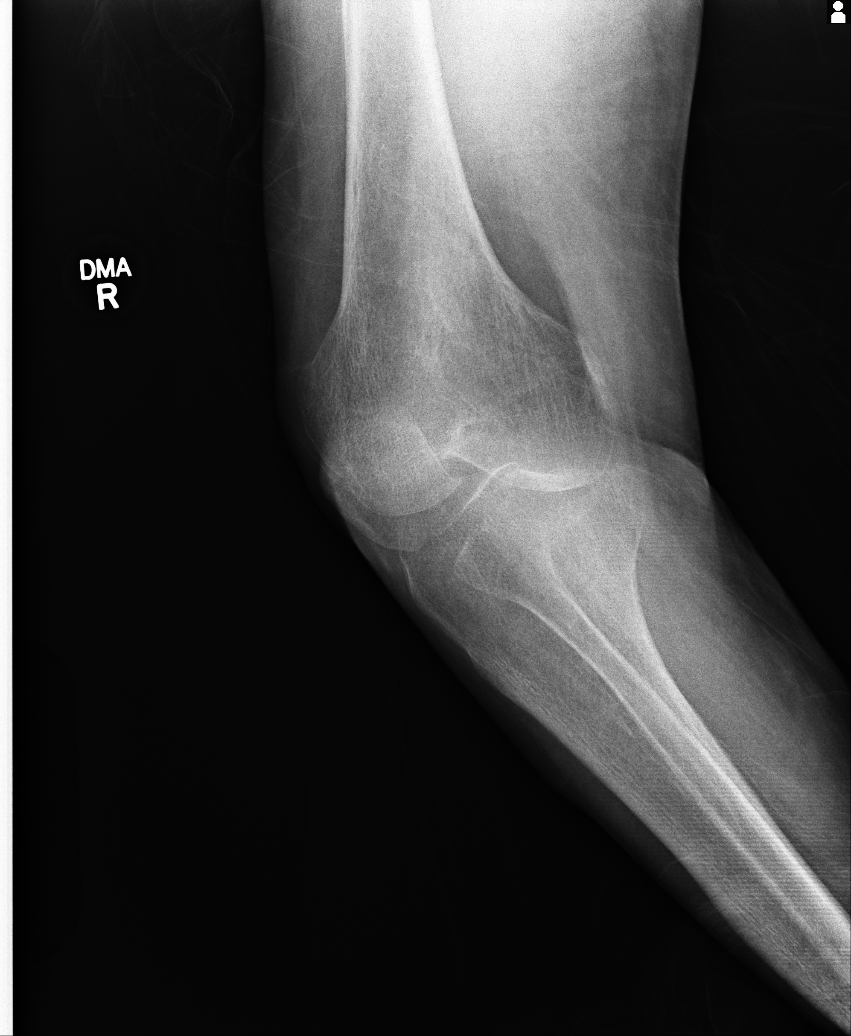
[im 3/6]
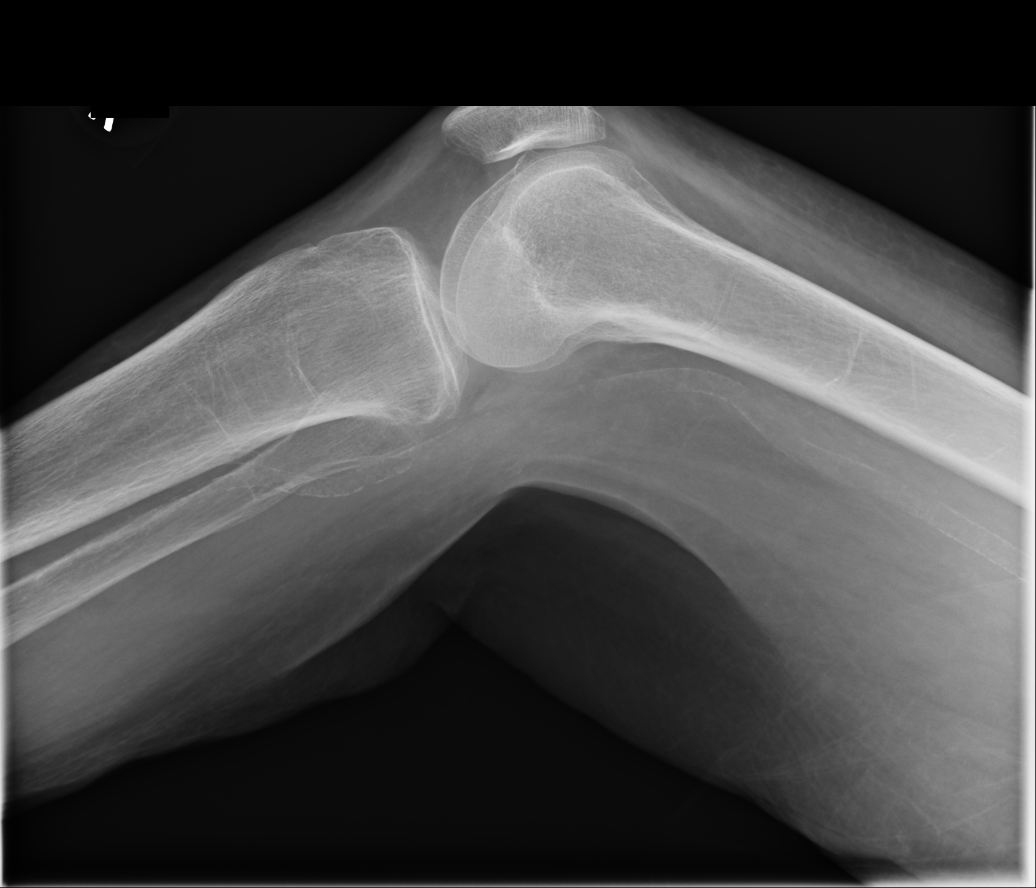
[im 4/6]
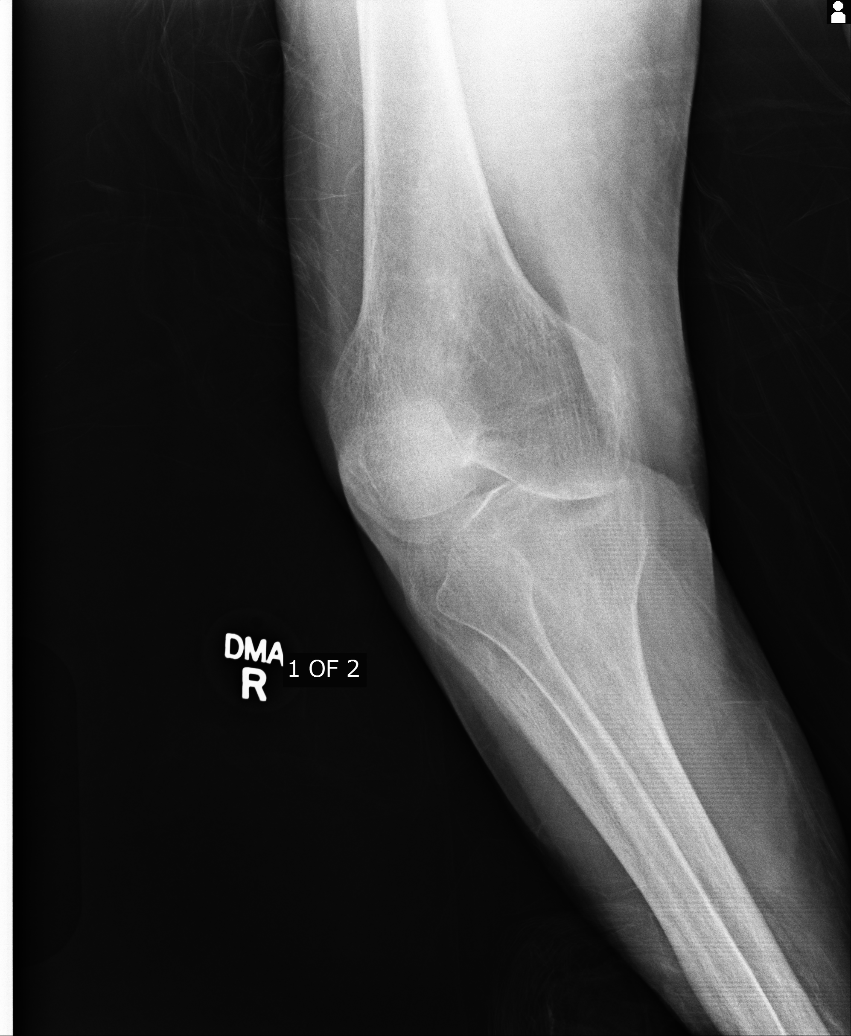
[im 5/6]
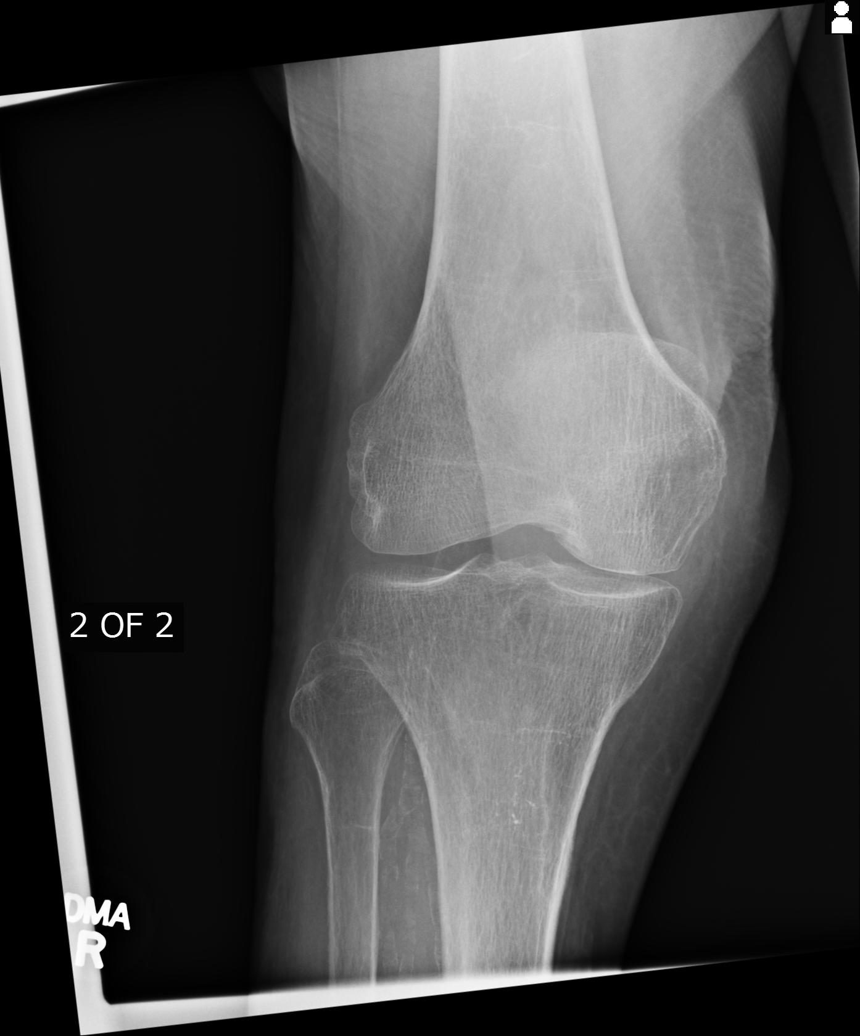
[im 6/6]
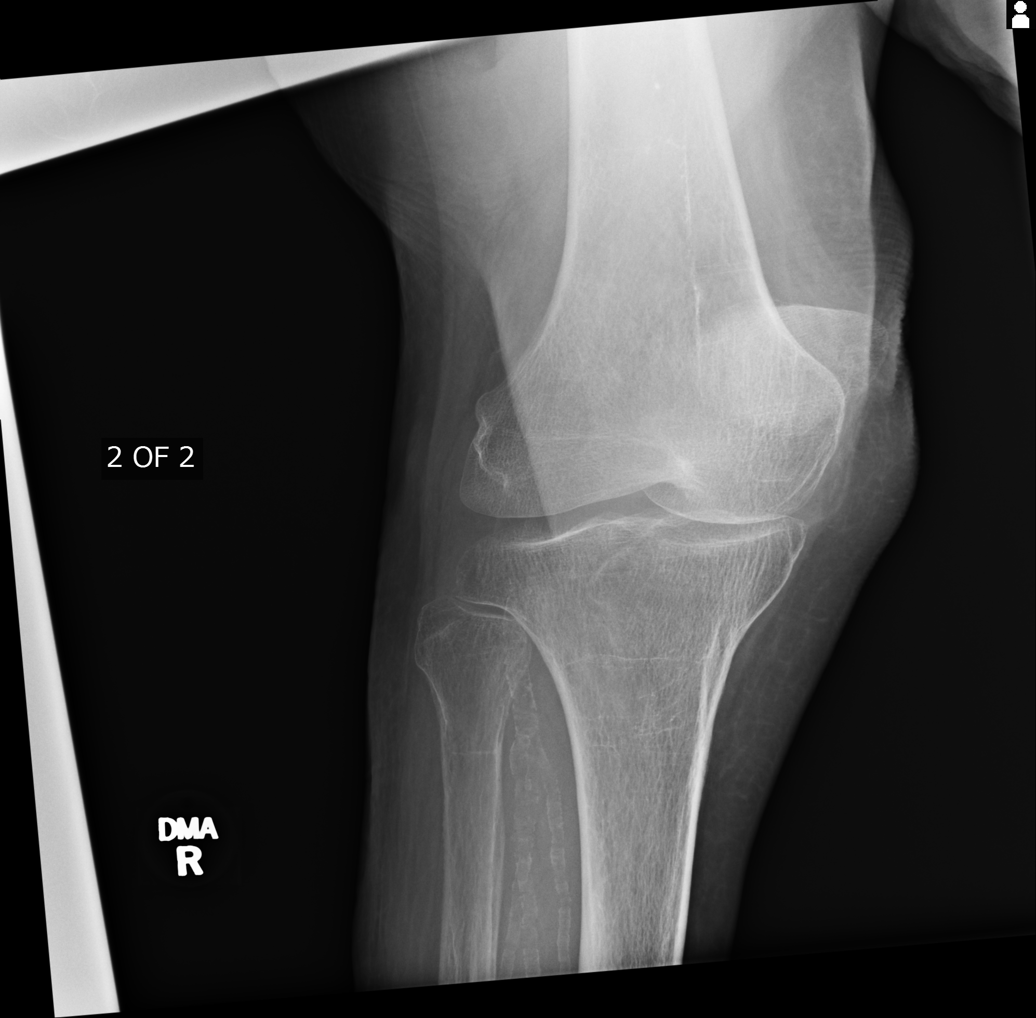

[6 of 6 positions shown; findings below may reference images not displayed]

IMPRESSION: Somewhat limited study. No definite acute bony abnormality
evident.

[REDACTED]

## 2014-07-17 IMAGING — CT CT HEAD WITHOUT CONTRAST
1 series · 15 of 30 positions shown, 19 images · non-contrast
Comparison: none

REASON FOR EXAM: altered mental status
COMMENTS:

PROCEDURE:     CT  - CT HEAD WITHOUT CONTRAST  - May 19, 2013  [DATE]
RESULT:     Comparison:  05/06/2013
TECHNIQUE: Multiple axial images from the foramen magnum to the vertex were
obtained without IV contrast.

[Series 2: soft tissue · axial · 0.43mm/px · z∈[+1229,+1369]mm · 15 of 32 slices shown, 19 images]
[im 2/32  brain]
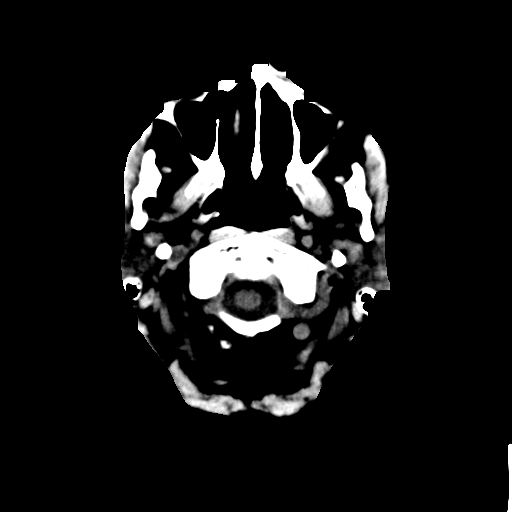
[im 2/32  bone]
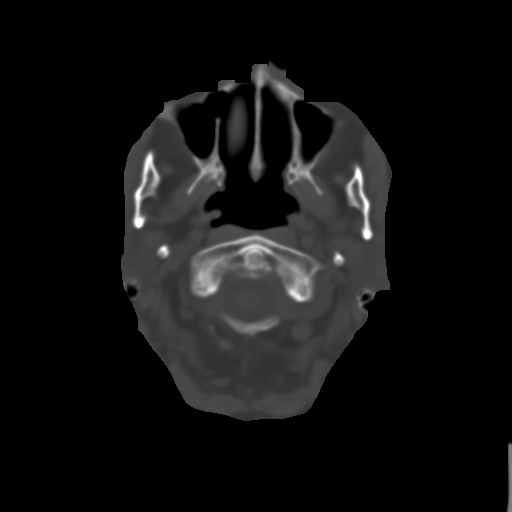
[im 4/32  brain]
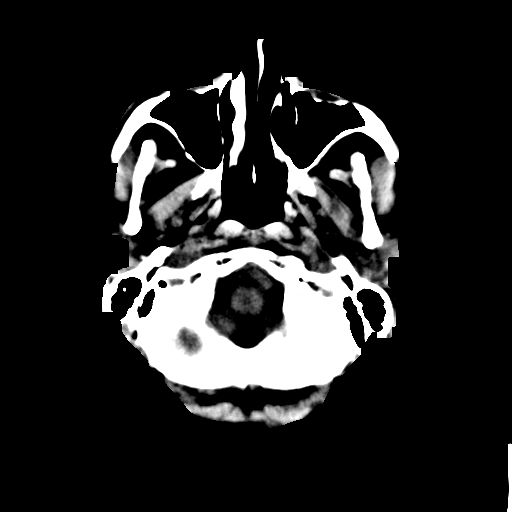
[im 6/32  brain]
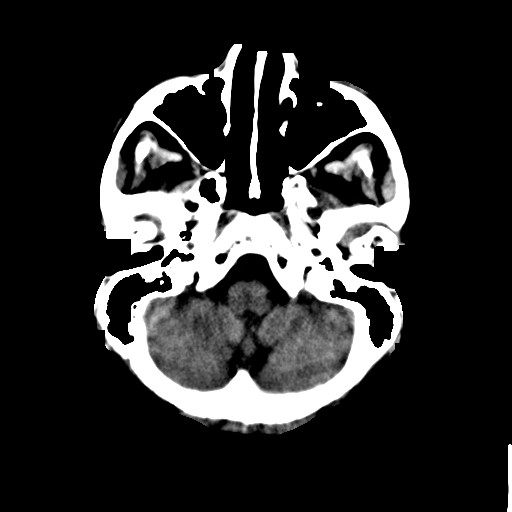
[im 8/32  brain]
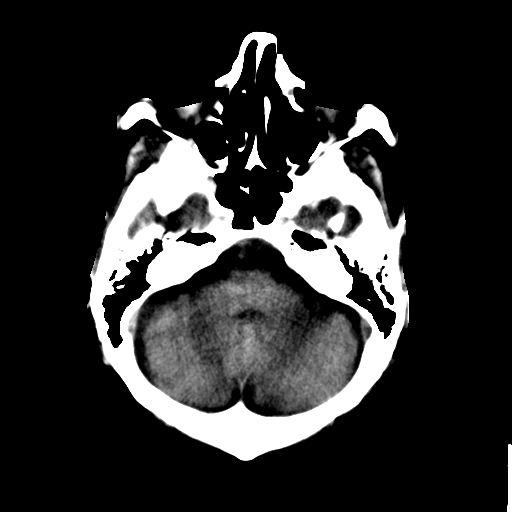
[im 10/32  brain]
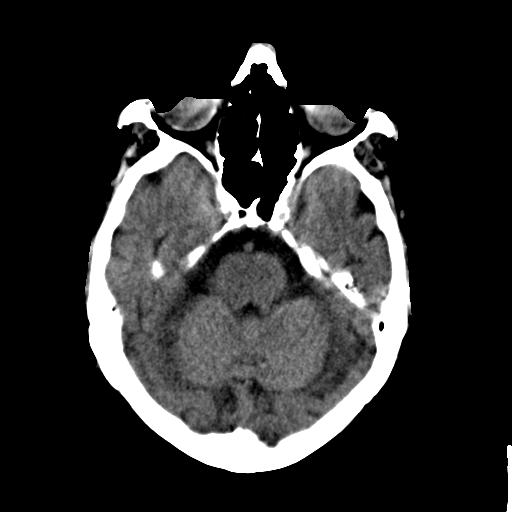
[im 10/32  bone]
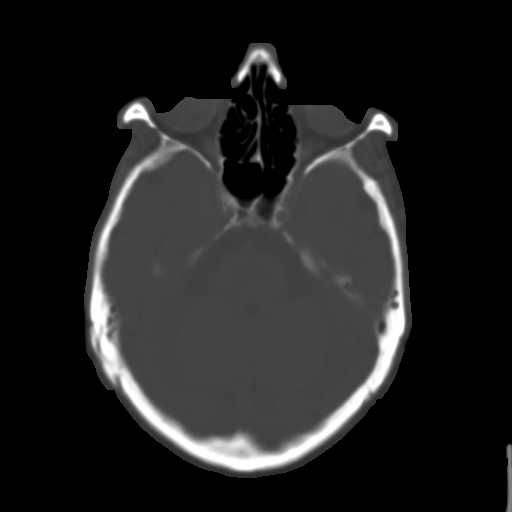
[im 12/32  brain]
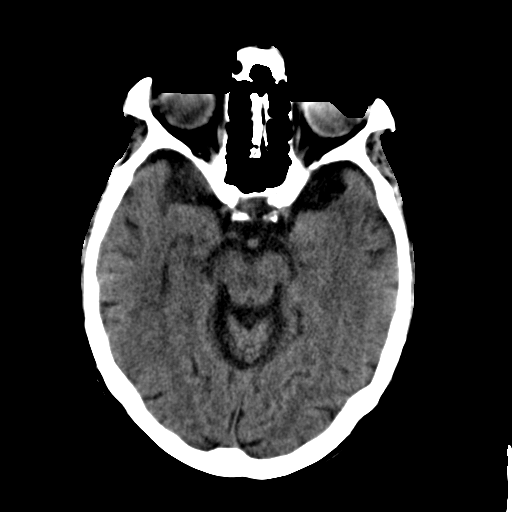
[im 14/32  brain]
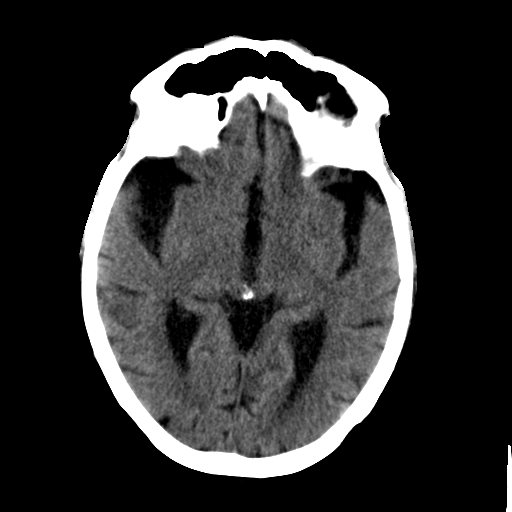
[im 17/32  brain]
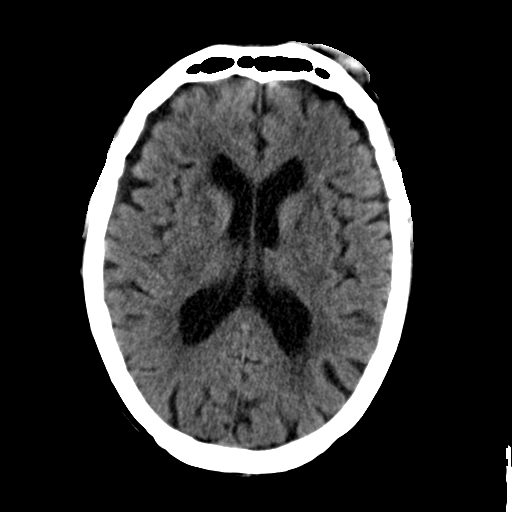
[im 18/32  brain]
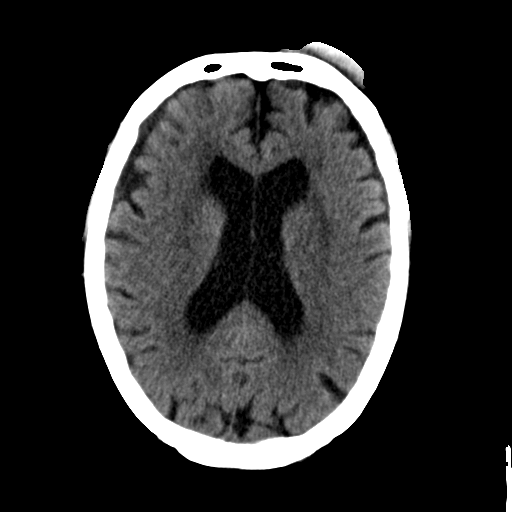
[im 18/32  bone]
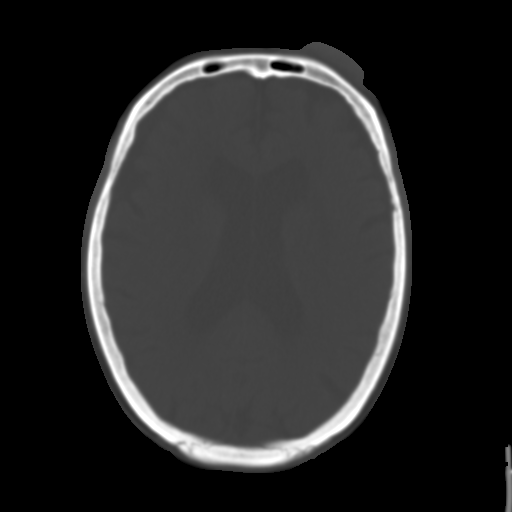
[im 20/32  brain]
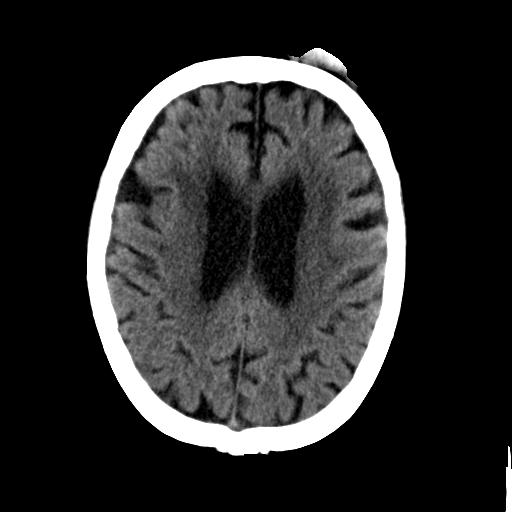
[im 22/32  brain]
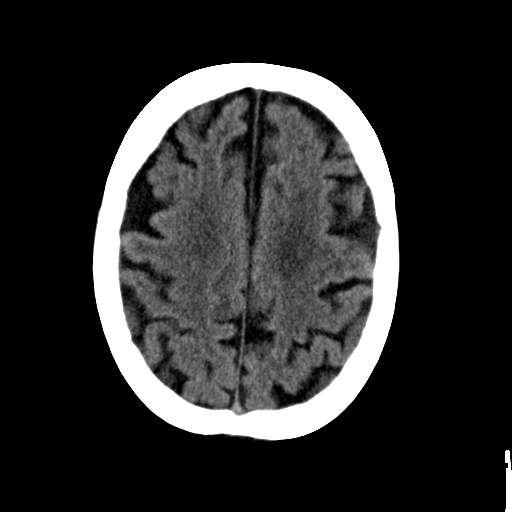
[im 24/32  brain]
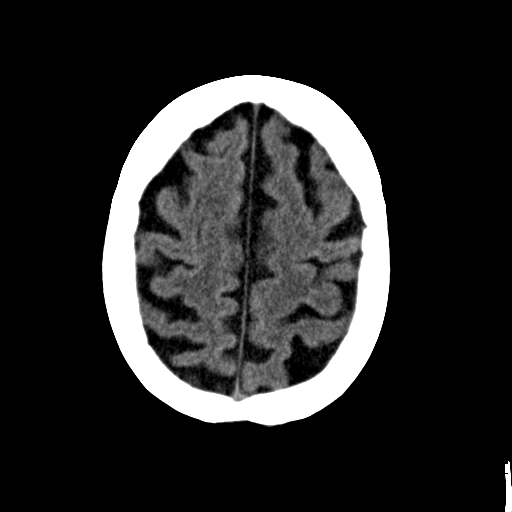
[im 26/32  brain]
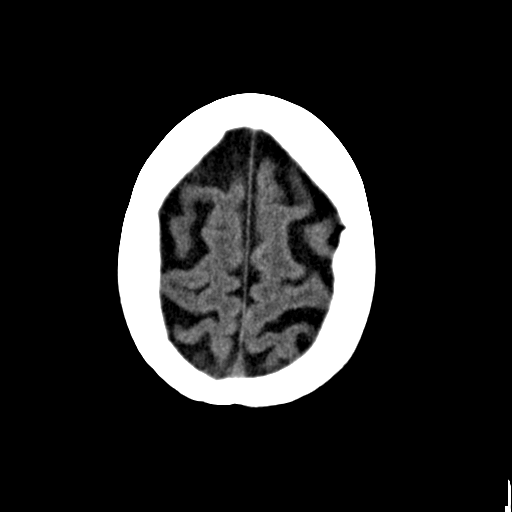
[im 26/32  bone]
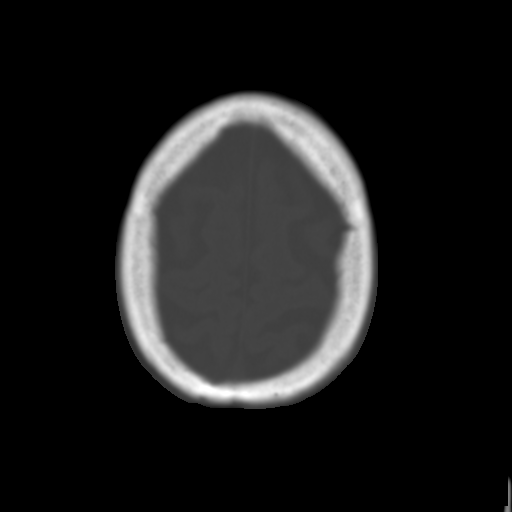
[im 28/32  brain]
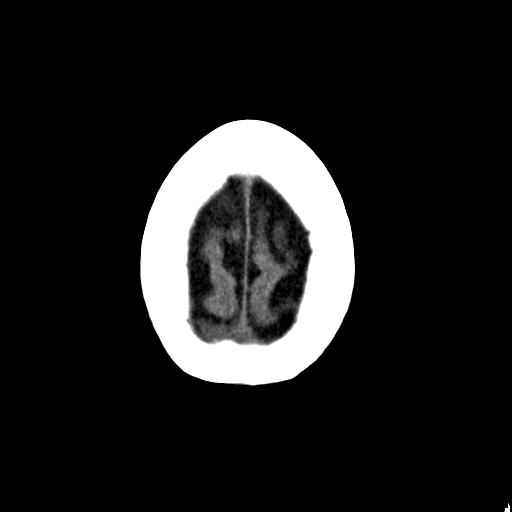
[im 30/32  brain]
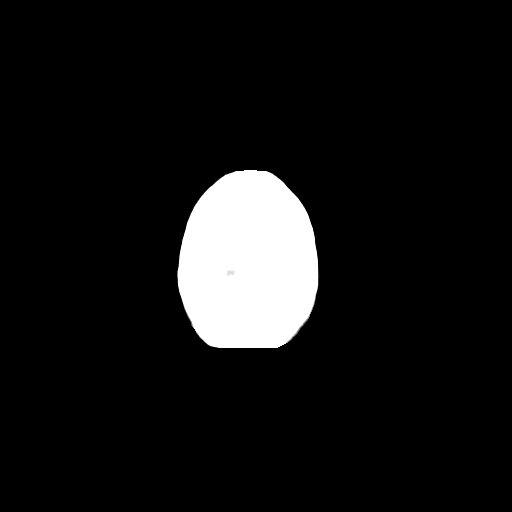

[15 of 30 positions shown; findings below may reference images not displayed]

FINDINGS: There is no evidence of mass effect, midline shift, or extra-axial fluid
collections.  There is no evidence of a space-occupying lesion or
intracranial hemorrhage. There is no evidence of a cortical-based area of
acute infarction. There is generalized cerebral atrophy. There is
periventricular white matter low attenuation likely secondary to
microangiopathy.

The ventricles and sulci are appropriate for the patient's age. The basal
cisterns are patent.

Visualized portions of the orbits are unremarkable. The visualized portions
of the paranasal sinuses and mastoid air cells are unremarkable.
Cerebrovascular atherosclerotic calcifications are noted.

The osseous structures are unremarkable. There is a large left frontal scalp
hematoma.
IMPRESSION: No acute intracranial process.

[REDACTED]

## 2015-02-19 NOTE — Discharge Summary (Signed)
PATIENT NAME:  Haley Lee, Haley Lee MR#:  [id number] DATE OF BIRTH:  1925/03/26  DATE OF ADMISSION:  08/01/2012 DATE OF DISCHARGE:  08/02/2012  PRIMARY CARE PHYSICIAN: Sarajane Marek, MD   DISCHARGE DIAGNOSES:  1. Atrial fibrillation with rapid ventricular rate.  2. Moderate mitral regurgitation.  3. Moderate aortic regurgitation.  4. Chest pain secondary to atrial fibrillation.   CONSULTS: Dr. Mariah Milling of Cardiology.   IMAGING STUDIES: Chest x-ray showed some basilar atelectasis. A 2-D echocardiogram showed 40 to 45% ejection fraction with aortic regurgitation, mitral regurgitation, and elevated RVSP of 50 to 60%.   ADMITTING HISTORY AND PHYSICAL: Please see detailed History and Physical dictated on 08/01/2012. In brief, the patient is an 81 year old Caucasian female patient with history of coronary artery bypass graft, hypertension, hyperlipidemia, who presented to the Emergency Room complaining of fatigue and palpitations. The patient was found to be in atrial fibrillation with rapid ventricular rate, received IV Cardizem, and was admitted to the Hospitalist Service for new onset atrial fibrillation.   HOSPITAL COURSE:  Atrial fibrillation with rapid ventricular rate: The patient was transitioned from IV Cardizem to p.o. Cardizem, later to long-acting Cardizem of 180 mg oral. The patient's blood pressure was in the normal range. Heart rate was controlled at 85, still in atrial fibrillation, which was causing her some fatigue. The patient was seen by Dr. Mariah Milling who suggested the patient be discharged home on Coumadin for anticoagulation along with Cardizem. The patient's echocardiogram showed ejection fraction of 40 to 45% with MR and AR. The patient is not on any beta blocker at this time but will be started on follow-up with Cardiology. Presently, he is on a calcium channel blocker for rate control. Also, her ARB from home has been stopped as her blood pressures are borderline with the new  medication. The patient has been started on Lasix low dose along with potassium supplementation.  On the day of discharge, the patient's heart rate is 85 irregular, blood pressure 115/69. Lung examination shows a diastolic systolic murmur. The patient is being discharged home in fair condition to follow up with Dr. Mariah Milling of Cardiology, who is also going to follow up on the patient's PT, INR.   DISCHARGE MEDICATIONS:  1. Calcium with vitamin D 1 tablet oral once a day.  2. Simvastatin 40 mg oral once a day.  3. Meloxicam 15 mg oral once a day.  4. Vitamin D3, 1 tablet oral once a day.  5. Losartan 100 mg oral once a day.  6. Aspirin 81 mg oral once a day.  7. Diltiazem 180 mg oral once a day.  8. Coumadin 3 mg oral once a day.  9. Lasix 20 mg oral once a day.  10. Potassium chloride 20 mg oral once a day.   NOTE: Clonidine has been stopped.   DISCHARGE INSTRUCTIONS:  1. Follow up with Dr. Mariah Milling and primary care physician in a week.  2. Continue taking Coumadin.  3. Return to the Emergency Room if there is any worsening of symptoms.  4. The patient will be on a cardiac diet.  5. Activity as tolerated.   This plan was discussed with the patient, who has verbalized understanding and is okay with the plan.   TIME SPENT: Time spent today on this discharge dictation along with coordinating care and counseling of the patient was 40 minutes.  ____________________________ Molinda Bailiff Delainie Chavana, MD srs:cbb D: 08/02/2012 14:20:52 ET T: 08/03/2012 10:25:14 ET JOB#: [id number] cc: Sarajane Marek, MD  Antonieta Ibaimothy J. Gollan, MD Orie FishermanSRIKAR R Fareeha Evon MD ELECTRONICALLY SIGNED 08/04/2012 11:20

## 2015-02-19 NOTE — Consult Note (Signed)
General Aspect 81 year old white female with history of coronary artery disease, CABG, angioplasty, HTN, recent fall two weeks ago (mechanical), who reports that over the past few days she has not been feeling well and has been feeling fatigued, significant SOB, weakbness, palpitations yesterday. Cardiology was consulted for atrial fibrillation.  She was  washing her dishes yesterday when she started having palpitations, and having left side chest pressure her chest. She also started feeling sweaty and had shortness of breath. She had some pain going down from her left jaw to her chest. Due to these she went to urgent care, received ASA and NTG, and emts brought her to ER.   In the ER she was noted to be in atrial fib with RVR. Started on diltiazem.   She reports that she normally walks about two miles [place] the mall [place] but recently has not been able to do that due t to fall two weeks ago, when she caught her foot on the wheel of the trolly. She bruised her knees.   She does report having some swelling in the LE, better today. Denies any orthopnea. No dyspnea on exertion. She denies any abdominal pain, nausea, vomiting, or diarrhea. Denies any frequency, urgency, or hesitancy. Denies any skin rash.    Present Illness . FAMILY HISTORY: History of hypertension.   SOCIAL HISTORY: Does not smoke. Does not drink. No drug abuse.   Physical Exam:   GEN well developed, well nourished, no acute distress    HEENT red conjunctivae    NECK supple  No masses    RESP normal resp effort  rhonchi  bases b/l    CARD Irregular rate and rhythm  No murmur    ABD denies tenderness  soft    SKIN normal to palpation    NEURO motor/sensory function intact    PSYCH alert, A+O to time, place, person, good insight   Review of Systems:   Subjective/Chief Complaint Fatigue, mild SOB, mild left side chest pain    General: Fatigue  Weakness    Skin: No Complaints    ENT: No Complaints    Eyes: No  Complaints    Neck: No Complaints    Respiratory: No Complaints    Cardiovascular: Dyspnea    Gastrointestinal: No Complaints    Genitourinary: No Complaints    Vascular: No Complaints    Musculoskeletal: left flank discomfort    Neurologic: No Complaints    Hematologic: No Complaints    Endocrine: No Complaints    Psychiatric: No Complaints    Review of Systems: All other systems were reviewed and found to be negative    Medications/Allergies Reviewed Medications/Allergies reviewed     Osteoporosis:    GERD - Esophageal Reflux:    HTN:    Hypercholesterolemia:    bilateral cataract:    Tonsillectomy:    Appendectomy:    Cholecystectomy:    Hysterectomy - Total:    CABG (Coronary Artery Bypass Graft):        Admit Diagnosis:   ATRIAL FIBRILLATION: 02-Aug-2012, Active, ATRIAL FIBRILLATION  Home Medications: Medication Instructions Status  Calcium 600+D 600 mg-200 intl units oral tablet 1 tab(s) orally once a day Active  losartan 100 mg oral tablet 1 tab(s) orally once a day Active  simvastatin 40 mg oral tablet 1 tab(s) orally once a day Active  meloxicam 15 mg oral tablet 1 tab(s) orally once a day Active  Vitamin D3 400 intl units oral tablet 1 tab(s) orally once a  day Active  clonidine 0.1 mg oral tablet 1 tab(s) orally 2 times a day, As Needed Active   Lab Results:  Routine Chem:  01-Oct-13 06:54    Glucose, Serum  103   BUN  27   Creatinine (comp) 0.83   Sodium, Serum 142   Potassium, Serum 4.0   Chloride, Serum 107   CO2, Serum 28   Calcium (Total), Serum  8.3   Anion Gap 7   Osmolality (calc) 288   eGFR (African American) >60   eGFR (Non-African American) >60 (eGFR values <35m/min/1.73 m2 may be an indication of chronic kidney disease (CKD). Calculated eGFR is useful in patients with stable renal function. The eGFR calculation will not be reliable in acutely ill patients when serum creatinine is changing rapidly. It is not useful  in  patients on dialysis. The eGFR calculation may not be applicable to patients at the low and high extremes of body sizes, pregnant women, and vegetarians.)   Cholesterol, Serum 105   Triglycerides, Serum 75   HDL (INHOUSE) 50   VLDL Cholesterol Calculated 15   LDL Cholesterol Calculated 40 (Result(s) reported on 02 Aug 2012 at 07:47AM.)  Routine Hem:  01-Oct-13 06:54    WBC (CBC) 3.9   RBC (CBC) 3.90   Hemoglobin (CBC)  10.8   Hematocrit (CBC)  33.7   Platelet Count (CBC)  145   MCV 86   MCH 27.8   MCHC 32.2   RDW 14.2   Neutrophil % 55.4   Lymphocyte % 29.4   Monocyte % 14.8   Eosinophil % 0.2   Basophil % 0.2   Neutrophil # 2.1   Lymphocyte # 1.1   Monocyte # 0.6   Eosinophil # 0.0   Basophil # 0.0 (Result(s) reported on 02 Aug 2012 at 07:30AM.)   EKG:   Interpretation EKG shows atrial fibrillation with rates in the 130s, repeat with rates in the 115 range    No Known Allergies:   Vital Signs/Nurse's Notes: **Vital Signs.:   01-Oct-13 09:52   Vital Signs Type Pre Medication   Temperature Temperature (F) 98.7   Celsius 37   Pulse Pulse 73   Respirations Respirations 18   Systolic BP Systolic BP 1169  Diastolic BP (mmHg) Diastolic BP (mmHg) 53   Mean BP 73   Pulse Ox % Pulse Ox % 95   Pulse Ox Activity Level  At rest   Oxygen Delivery Room Air/ 21 %     Impression 81year old white female with history of coronary artery disease, CABG, angioplasty, HTN, recent fall two weeks ago (mechanical), who reports that over the past few days she has not been feeling well and has been feeling fatigued, significant SOB, weakbness, palpitations yesterday. Cardiology was consulted for atrial fibrillation.  1) Atrial fibrillation: Rate improved on diltiazem Q6 Agree with change to long acting po cardizem (could try 120 mg of BP remains low) -Would consider starting xarelto at discharge as uncertain how long she has been in atrial fib (vague fatigue for one to two weeks  since her fall) --Needs close follow up. If she continues to have fatigue sx as an outpt, may need to consider cardioversion. Will liekly need 4 weeks anticoagulation before cardioversion to avoid a TEE Echo pending today  2) CAD. CABG: Stable, no new ischemic event cardiac enz neg x 4 Continue asa and statin  3) HTN: hold losrtan and clonidine as BP low on diltiazem  4) recent fall: Hurt knees, slow  recovery, in general gait is stable.  Accidental fall when foot hit wheel of cart in store. Likely safe to start anticoagulation in effort to get her out of atrial fib in teh near future then stop anticoagulation   Electronic Signatures: Ida Rogue (MD)  (Signed 01-Oct-13 10:14)  Authored: General Aspect/Present Illness, History and Physical Exam, Review of System, Past Medical History, Health Issues, Home Medications, Labs, EKG , Allergies, Vital Signs/Nurse's Notes, Impression/Plan   Last Updated: 01-Oct-13 10:14 by Ida Rogue (MD)

## 2015-02-19 NOTE — H&P (Signed)
PATIENT NAME:  Haley Lee, Haley Lee MR#:  [id number] DATE OF BIRTH:  [birth year]  DATE OF ADMISSION:  08/01/2012  [facility]  CARDIOLOGIST: None ED REFERRING PHYSICIAN: Dr. Dorothea GlassmanPaul Malinda    CHIEF COMPLAINT: Chest pain, palpitations.   HISTORY OF PRESENT ILLNESS: Patient is an 81 year old white female with history of coronary artery disease, hypertension, hyperlipidemia who has had CABG and angioplasty who reports that over the past few days she has not been feeling well and has been feeling fatigued. Earlier today she was trying to wash her dishes when she started having palpitations, her heart beating very fast and also started having pressure in her chest. The symptoms continued to persist. She also started feeling sweaty and had shortness of breath. She had some pain going down from her left jaw to her chest. Due to these she came to the ED. In the ER she was noted to be tachycardic. Initially had an EKG which showed accelerated rhythm, however, subsequently was noted to be in atrial fibrillation with RVR. Patient reports that she has not had feeling of her heart skipping beats recently but about five years ago was "in and out of her heart rhythm". She reports that she normally walks about two miles in the mall daily but recently has not been able to do that due to feeling very fatigued. She also feels very hot and cold. She otherwise denies any lower extremity swelling. Denies any orthopnea. No dyspnea on exertion. She denies any abdominal pain, nausea, vomiting, or diarrhea. Denies any frequency, urgency, or hesitancy. Denies any skin rash.   PAST MEDICAL HISTORY:  1. History of CABG in 1991. She reports one vessel CABG, also had angioplasty done to the circumflex in 2009.  2. History of B12 deficiency.  3. Hypertension.  4. Hyperlipidemia.  5. History osteoporosis.  6. Questionable history of atrial fibrillation.  7. Gastroesophageal reflux disease.     PAST SURGICAL HISTORY:  1. Status post CABG. 2. Bilateral cataract surgery.  3. Status post cholecystectomy.  4. Status post appendectomy.  5. Status post hysterectomy.  6. Status post tonsillectomy.   ALLERGIES: Allergies to no medications.   CURRENT MEDICATIONS:  1. Calcium plus vitamin D 1 tab p.o. daily.  2. Clonidine 0.1 mg 1 tab p.o. b.i.d. as needed.  3. Losartan 100 daily.  4. Meloxicam 15 daily.  5. Simvastatin 40 at bedtime.  6. Vitamin D3 400 international units daily.   FAMILY HISTORY: History of hypertension.   SOCIAL HISTORY: Does not smoke. Does not drink. No drug abuse.   REVIEW OF SYSTEMS: CONSTITUTIONAL: Complains of chest pain. No weight loss. No weight gain. EYES: No blurred vision. No pain. No redness. No inflammation. ENT: No tinnitus. No ear pain. No hearing loss. No seasonal or year-round allergies. No difficulty swallowing. RESPIRATORY: Denies any cough, wheezing, hemoptysis. Complains of some dyspnea. No chronic obstructive pulmonary disease. No tuberculosis. CARDIOVASCULAR: Complains of chest pressure today. Denies any orthopnea, edema. Also complains of palpitations. No syncope. GASTROINTESTINAL: No nausea, vomiting. Complains of abdomen that is feeling distended and tight. Denies any irritable bowels. GENITOURINARY: Denies any dysuria, hematuria, renal calculus or frequency. ENDO: Denies any polyuria, nocturia, or thyroid problems. HEME/LYMPH: Denies anemia, easy bruisability, or bleeding. SKIN: No acne. No rash. No changes in mole, hair or skin. MUSCULOSKELETAL: Denies any pain in neck, back. No gout. No redness. NEUROLOGICAL: No numbness. No cerebrovascular accident. No transient ischemic attack. No seizures. PSYCHIATRIC: No anxiety. No insomnia.  No ADD.   PHYSICAL EXAMINATION:  VITAL SIGNS: Temperature 98.2, pulse 115, respirations 20, blood pressure 125/77.   GENERAL: Patient is an elderly female in no acute distress.   HEENT: Head atraumatic,  normocephalic. Pupils equally round, reactive to light and accommodation. There is no conjunctival pallor. No scleral icterus. Nasal exam shows no drainage or ulceration. Oropharynx is clear without any exudates.   NECK: No thyromegaly. No carotid bruits.   CARDIOVASCULAR: Irregularly irregular rhythm with systolic murmur at the left sternal border. PMI is not displaced.   LUNGS: Clear to auscultation bilaterally without any rales, rhonchi, wheezing.   ABDOMEN: Distended, diminished bowel sounds. There is no guarding. No rebound. There is mild tenderness.   EXTREMITIES: No clubbing, cyanosis, edema.   SKIN: No rash.   LYMPHATICS: No lymph nodes palpable.   VASCULAR: Good DP, PT pulses.   PSYCHIATRIC: Not anxious or depressed.   NEUROLOGICAL: Awake, alert, oriented x3. No focal deficits.   LABORATORY, DIAGNOSTIC AND RADIOLOGICAL DATA: EKG shows atrial fibrillation with RVR. Glucose 132, BUN 29, creatinine 0.92, sodium 144, potassium 4.0, chloride 108, CO2 27. LFTs are normal except albumin of 3.2. Troponin less than 0.02 x2. TSH 2.60, WBC 5.3, hemoglobin 11.7, platelet count 173.   ASSESSMENT AND PLAN: Patient is an 81 year old white female with history of coronary artery disease, CABG presents with chest pain and heart beating fast.  1. Atrial fibrillation with RVR. At this time I will give her a dose of IV Cardizem. Will start on p.o. Cardizem. She does have a CHADS2 score of 2. At this time I will treat her with full dose Lovenox. Cardiology evaluation. Further anticoagulation treatment based on cardiology evaluation. Will check echocardiogram and TSH.  2. Chest pain. Will check serial cardiac enzymes. Cardiology evaluation. May need further evaluation as previously. Will place nitro patch for time being. 3. Hypertension. Will hold clonidine in light of her starting on nitro patch as well Cardizem.  4. Hyperlipidemia. Will continue simvastatin. Will check a fasting lipid panel in the  a.m. 5. Abdominal distention of unclear cause. Will get abdominal three view. Check lipase and LFTs.  6. Gastroesophageal reflux disease. Patient currently not on any treatment for that. Will place her on PPIs prophylactic.  7. Miscellaneous. Patient will be on Lovenox for deep vein thrombosis prophylaxis.   TIME SPENT: 45 minutes spent.    ____________________________ Lacie Scotts. Allena Katz, MD shp:cms D: 08/01/2012 19:49:59 ET T: 08/02/2012 07:15:45 ET JOB#: [id number]  cc: Elcie Pelster H. Allena Katz, MD, <Dictator> [facility], Dr. Levada Dy MD ELECTRONICALLY SIGNED 08/02/2012 18:13

## 2015-02-22 NOTE — H&P (Signed)
PATIENT NAME:  Haley Lee, Haley Lee MR#:  [id number] DATE OF BIRTH:  11/04/1924  DATE OF ADMISSION:  05/19/2013  REASON FOR ADMISSION: Altered mental status.   REFERRING PHYSICIAN: Caleen Jobs. Brien Mates, MD  PRIMARY CARDIOLOGISTS: Antonieta Iba, MD, and Chelsea Aus Kirke Corin, MD  HISTORY OF PRESENT ILLNESS:  Ms. Haley Lee is a very nice 81 year old female who has history of very difficult care in the last several months. The patient was recently admitted over here on July 10 due to a fall. At that moment, she also had a previous fall where she landed face down a week prior when she was trying to pick up a penny from the parking lot. The patient was brought to the Emergency Department then, and CT scan showed a small hematoma on the left forehead but no intracranial bleeding or anything like that. The patient was already taking Coumadin. She was sent back to the home, and then she came back with a new fall and a hip fracture of the right hip. The patient was evaluated, and she underwent an open reduction and internal fixation of the right femoral neck fracture with a right total hip replacement. The patient was discharged to the nursing home and today, apparently she was active in the morning in the wheelchair. Around 11:00, she was found slumped forward with her head down to her knees, not responsive. She was not able to be awakened up until she arrived here to the Emergency Department. This lasted for at least 6 hours or more, and then after the x-ray she was back to being her normal self. She was not complaining of any headaches. No chest pain. She was taken off her Coumadin last time that she was here secondary to surgery, and overall she was doing okay up until today. In the ER whenever she was evaluated, CT scan of the head did not show any significant abnormalities like bleeding. She has signs of dehydration with a BUN of 62, creatinine of 1.31, sodium of 134. She has elevated liver enzymes, low protein.  She has a non-ST elevation myocardial infarction with a troponin 1.2, total CK of 222. Her hemoglobin is 8.5 postoperatively, and her white count is normal. The patient had also a chest x-ray that did not show any significant pneumonia, but she has some mild changes of interstitial edema. Her EKG has indeterminate changes, atrial fibrillation and ST depression with T wave inversion at the level of V2, V1, has a 1 mm ST elevation. After commenting all those results the patient the family, the family actually does not want anything done. They want to proceed with more comfort care.   REVIEW OF SYSTEMS: Unable to obtain a full review of systems, as the patient has some mild dementia and not very cooperative right now. She was able to wake up and answer some questions, but at this moment she does not want to answer all of the questions.  CONSTITUTIONAL: As per family, she has not been weak more than usually. She has not had any fever.   EYES: No blurry vision, double vision.  EARS, NOSE, THROAT: No tinnitus that the family can tell. No difficulty swallowing. RESPIRATORY: The patient has not been short of breath, has not had any cough.  CARDIOVASCULAR: The patient has not had any chest pain that she can tell. No palpitations. No syncope.  GASTROINTESTINAL: No nausea, vomiting, abdominal pain. She is eating okay.  GENITOURINARY: No dysuria or hematuria.  ENDOCRINE: No polyuria or polydipsia. No  cold or heat intolerance.  HEMATOLOGIC AND LYMPHATIC: Positive anemia post surgery. Positive easy bruising. Positive hematoma of the face.  MUSCULOSKELETAL: Positive pain in her back and bilateral hips and knees.  NEUROLOGIC: Positive altered mental status today.  PSYCHIATRIC: Altered mental status. Mild signs of dementia.   PAST MEDICAL HISTORY: 1.  Coronary artery disease, status post CABG.  2.  Chronic atrial fibrillation.  3.  Ischemic cardiomyopathy with an ejection fraction of 40% to 45%.  4.   Osteoporosis.  5.  GERD.  6.  Hypertension.  7.  Hypercholesterolemia.  8.  Possible beginnings of dementia.   PAST SURGICAL HISTORY: 1.  Bilateral cataract.  2.  Right total hip replacement due to hip fracture.  3.  Coronary artery bypass graft.  4.  Tonsillectomy.  5.  Appendectomy.  6.  Cholecystectomy.  7.  Total hysterectomy.   ALLERGIES: No known drug allergies.   SOCIAL HISTORY: The patient used to live by herself and was ambulating normally with a walker, but after the fall she has been discharged to a skilled nursing facility. The patient has never smoked. She does not drink.   FAMILY HISTORY: Positive for hypertension in her parents.   CURRENT MEDICATIONS: Vitamin D 400 units daily, tramadol 50 mg every 4 hours as needed for pain, sodium polystyrene sulfonate 120 mL orally, simvastatin 40 mg once a day, Prilosec 20 mg once daily, meloxicam 15 mg once daily, magnesium hydroxide 30 mg twice daily, Lopressor 25 mg once daily, Klor-Con once a day, Ensure 3 times daily, docusate one 2 times a day, ciprofloxacin 250 mg every 12 hours, calcium plus vitamin D once a day, bisacodyl 10 mg rectally, aspirin 325 mg daily, Tylenol p.r.n.   All of these medications are going to be stopped, as the family wants to proceed with comfort care only.   PHYSICAL EXAMINATION: VITAL SIGNS: Her blood pressure was 112/63, pulse 125, respirations 20, temperature 97.8, initial temperature 99.2.  GENERAL: The patient was obtunded, but now is alert. She is cooperative. She is slightly confused.  HEENT: Pupils are equal and reactive. Extraocular movements are intact. Mucosae are dry. Anicteric sclerae. Pink conjunctivae. No oral lesions. No oropharyngeal exudates.  NECK: Supple. No JVD. No thyromegaly. No adenopathy. No carotid bruits.  CARDIOVASCULAR: Irregularly irregular. No murmurs, rubs or gallops. No tenderness to palpation of anterior chest wall. No displacement of PMI.  LUNGS: Clear without any  wheezing or crepitus. No use of accessory muscles.  ABDOMEN: Soft, nontender, nondistended. No hepatosplenomegaly. No masses. Bowel sounds are positive.  GENITAL: Negative for external lesions.  EXTREMITIES: No cyanosis or clubbing. Positive trace edema.  VASCULAR: Pulses +2. Capillary refill less than 3.  NEUROLOGIC: Cranial nerves II through XII intact. Strength is 3 to 4/5 in all 4 extremities, equal. Generally debilitated, but no focal findings.  PSYCHIATRIC: Mood is  slightly confused, cooperative, no agitation.  LYMPHATIC: Negative for lymphadenopathy in neck with neck or supraclavicular areas.  SKIN: Hematoma of the left forehead with ecchymosis all around her face, neck and upper chest.  MUSCULOSKELETAL: No significant joint effusions.   LABORATORY DATA: Glucose 116, BUN 62, creatinine 1.31, sodium 132, albumin 26, AST 96, alkaline phosphatase 164. Troponin 1.2, total CK 222. White cells 8, hemoglobin 8.5, platelets 215. INR 1.3. Urinalysis is negative for urinary tract infection.   ASSESSMENT AND PLAN: This is a very nice 81 year old female who I had the pleasure to meet on previous admissions and now back again today. She is coming in  with significant altered mental status and non-ST elevation myocardial infarction.  1.  Altered mental status: The patient had a CT of the head that was overall negative for cerebrovascular accident or bleeding. Since the patient has been taking her Coumadin and she has atrial fibrillation and then she also had a hip fracture, she is at risk of thromboembolic events. The patient could have a transient ischemic attack versus a full stroke, but at this moment, we are not going to pursue that. We are going to go into comfort care.  2.  Non-ST elevation myocardial infarction: The same. The patient does not want any major measures. Troponins are elevated. We are not going to follow troponins. We are going to stop her medications. We are not going to start a beta  blocker, heparin or any statin, as the patient wants to be comfort care.  3.  Status post hip fracture: Stop physical therapy. Monitor and treat mostly pain. The patient is agreeing with this. Family is agreeing with this.  4.  As far as her atrial fibrillation: She went into rapid ventricular response. She looks dehydrated. We are going to give her some fluids for comfort. Since she has congestive heart failure, systolic ejection fraction of less than 40%, we are going to avoid fluid overloading her, but she needs some fluids due to her dehydration.  5.  Comfort care: Palliative care consult placed. Hospice care consult placed. The patient will benefit from a hospice home for sure. The patient is going to be a DO NOT RESUSCITATE.   TIME SPENT: I spent about 50 minutes talking with the family and the patient and counseling.    ____________________________ Felipa Furnace, MD rsg:jm D: 05/19/2013 20:15:51 ET T: 05/19/2013 21:27:15 ET JOB#: [id number]  cc: Felipa Furnace, MD, <Dictator> Cleston Lautner Juanda Chance MD ELECTRONICALLY SIGNED 05/26/2013 20:48

## 2015-02-22 NOTE — Consult Note (Signed)
PCP Dr Gypsy DecantFeldspaucshDr MEnz Preop medical clearance for right hip fracture s/p mech fall at homeis at moderate risk for surgery given her age and medical co-morbiditiesto take for surrgery once INR<1.5 coagulopathy due to coumadin for chronic afibhas been falling at home with severe facal briuse and left forehead hematoma cuamdin for now since rsik of bleeding is higher given t's falls then benefit. will d/w dr Mariah Millinggollan.and niece agreesindication for FFP at pesentfor surgey once INR<1.5INR in am afibin the 80's UTIcipro (day 2 ) s/p CABG several years ago dter and niece   Electronic Signatures: Willow OraPatel, Dorell Gatlin A (MD)  (Signed on 10-Jul-14 15:34)  Authored  Last Updated: 10-Jul-14 15:34 by Willow OraPatel, Nehal Shives A (MD)

## 2015-02-22 NOTE — Op Note (Signed)
PATIENT NAME:  Haley Lee, Haley Lee MR#:  [id number] DATE OF BIRTH:  [birth year]  DATE OF PROCEDURE:  05/13/2013  PREOPERATIVE DIAGNOSIS: Right femoral neck fracture and osteoarthritis.   POSTOPERATIVE DIAGNOSIS:  Right femoral neck fracture and osteoarthritis.   PROCEDURE: Right total hip replacement.   ANESTHESIA: General.   SURGEON: Leitha SchullerMichael J. Reno Clasby.   DESCRIPTION OF PROCEDURE: The patient was brought to the Operating Room and after adequate general anesthesia was obtained the patient was placed on the operative table with the right foot in the Medacta attachment, left leg on a padded rest. Lee-arm was brought in, and with traction, length was restored and preop picture obtained of the hip for subsequent comparison to trial components. The hip was then prepped and draped using barrier drape method. After appropriate patient identification and timeout procedures were completed, an anterior approach was made to the hip centered over the tensor fascia lata muscle and incision carried down through the skin and subcutaneous tissue. The tensor fascia lata fascia was incised and the muscle retracted laterally. The rectus sheath was then opened and the rectus retracted medially. Circumflex vessel was coagulated and an anterior capsulotomy was carried out with a flap created for placement of the modified Charnley retractor. With the leg in external rotation and slight traction, the head was removed and there was degenerative change present. Ligamentum teres and some scar tissue of the pulvinar was debrided and the acetabulum reamed to 54 mm.  A 54 mm cup was placed with a screw superiorly to aid in fixation, followed by an appropriate size liner with a 36 mm internal diameter for improved stability. The pubofemoral and ischiofemoral ligaments were released and the leg brought into extension. Rongeur was used to smooth the fracture at its base with a box osteotome used to remove some the lateral neck, which was  remaining. Sequential broaching was carried out to a 5, which gave a nice tight fit. The 5 broach was left in for trialing and with standard neck and a 0 head, the leg lengths appeared appropriate. These components were then chosen and after thorough irrigation of the joint. The #5 AMIS stem was impacted down to the canal. The 36 medium head +0 mm was then impacted onto the stem and with internal rotation, the head was reduced. Intraoperative x-ray at this point, was made for comparison purposes and appeared leg lengths were equal. The wound was thoroughly irrigated. There was quite a bit of bleeding from the patient with her anticoagulation from bony surfaces as well as from soft tissues. Cautery was obtained as best as possible. The deep fascia was repaired using a heavy quill suture. A subcutaneous drain was placed followed by 2-0 quill subcutaneously after 30 mL of 0.25% Sensorcaine with epinephrine was infiltrated into the area of the incision. Skin staples were used, followed by Xeroform, 4 x 4's, ABD, and tape. The patient was transferred to her bed in stable condition.   ESTIMATED BLOOD LOSS: 1000.   COMPLICATIONS: None.   SPECIMEN: Femoral head.   IMPLANTS: Medacta size M 36 mm femoral head with a size 5 standard AMIS stem collared. A Versafit cup CC 54 mm with a 20 mm flathead screw and a size E liner, inner diameter of 36 mm.   CONDITION: To recovery room, stable.   ____________________________ Leitha SchullerMichael J. Tyler Cubit, MD mjm:dp D: 05/13/2013 12:17:37 ET T: 05/13/2013 13:58:37 ET JOB#: [id number]  cc: Leitha SchullerMichael J. Shivangi Lutz, MD, <Dictator> Leitha SchullerMICHAEL J Allia Wiltsey MD ELECTRONICALLY SIGNED 05/15/2013 14:41

## 2015-02-22 NOTE — Consult Note (Signed)
PATIENT NAME:  Haley Lee, Haley Lee MR#:  [id number] DATE OF BIRTH:  January 12, 1925  DATE OF CONSULTATION:  05/11/2013  REFERRING PHYSICIAN:   CONSULTING PHYSICIAN:  Leitha SchullerMichael J. Haelee Bolen, MD  HISTORY OF PRESENT ILLNESS:  The patient is an 81 year old who has been an independent ambulator at home, suffered a fall today.  York SpanielSaid it just felt like the hip gave out and denies loss of consciousness and had immediate pain in the hip, was unable to bear weight.   PHYSICAL EXAMINATION:  On exam, she is found to have a completely displaced femoral neck fracture as well as shortening and external rotation of the leg.  She is able to flex and extend the toes and has a palpable pulse of the dorsalis pedis.  Additionally, she has extensive ecchymosis over her arms and face from a recent fall a week ago.  She is on Coumadin and that will need to be addressed prior to operative intervention.    RADIOLOGIC STUDIES:  X-rays also confirm moderate hip osteoarthritis.   CLINICAL IMPRESSION:  Displaced femoral neck fracture and hip arthritis.   RECOMMENDATION:  Is for hip replacement surgery, direct anterior approach to get her ambulatory as quick as possible, but this will need to wait for her INR to come down to safely have surgery.  Also, we will consult Dr. Mariah MillingGollan with which present significant problems with operative intervention with anesthetic as well as intraoperative blood loss.     ____________________________ Leitha SchullerMichael J. Horacio Werth, MD mjm:ea D: 05/11/2013 21:48:47 ET T: 05/11/2013 23:29:41 ET JOB#: [id number]  cc: Leitha SchullerMichael J. Elya Tarquinio, MD, <Dictator> Leitha SchullerMICHAEL J Rand Etchison MD ELECTRONICALLY SIGNED 05/12/2013 6:55

## 2015-02-22 NOTE — Discharge Summary (Signed)
PATIENT NAME:  Haley Lee, Haley Lee MR#:  [id number] DATE OF BIRTH:  13-Dec-1924  DATE OF ADMISSION:  05/19/2013 DATE OF DISCHARGE:  05/22/2013  HISTORY OF PRESENT ILLNESS: For a detailed note, please take a look at the history and physical on admission done by Dr. Mordecai MaesSanchez.   DIAGNOSES AT DISCHARGE:  1.  Non-ST-elevation myocardial infarction.  2.  Recent right hip fracture.  3.  Failure to thrive with severe debilitation.  4.  Cardiomyopathy.   DISPOSITION: The patient was being discharged to hospice home.   ACTIVITY: As tolerated.   PERTINENT STUDIES DONE DURING THE HOSPITAL COURSE: A CT scan of the head done without contrast on admission showing no acute intracranial process. A chest x-ray done on admission showing findings concerning of mild interstitial edema.   CONSULTANTS DURING THE HOSPITAL COURSE: Dr. Harriett SineNancy Phifer from palliative care.   BRIEF HOSPITAL COURSE: This is an 81 year old female, with medical problems as mentioned above, who presented to the hospital on May 19, 2013 secondary to altered mental status and noted to have a non-ST-elevation MI. The patient has had recurrent hospitalizations over the past few months. She was admitted to the hospital from July 10 through July 15 after suffering a fall and a femoral neck fracture. She has dementia with some debilitated state and her quality of life had been progressively getting worse.   Upon arrival to this hospitalization, the patient's family did not want to pursue any aggressive care and wanted the patient just to be kept comfortable. The patient was therefore admitted only under comfort care only. The patient was seen by palliative care on the Monday after admission, thought was appropriate for hospice home and therefore she was discharged there. The family was in agreement with this plan.   TIME SPENT: 35 minutes.    ____________________________ Rolly PancakeVivek J. Cherlynn KaiserSainani, MD vjs:np D: 05/22/2013 14:18:54 ET T: 05/22/2013 18:35:05  ET JOB#: [id number]  cc: Rolly PancakeVivek J. Cherlynn KaiserSainani, MD, <Dictator> Houston SirenVIVEK J Sowmya Partridge MD ELECTRONICALLY SIGNED 06/03/2013 15:14

## 2015-02-22 NOTE — Consult Note (Signed)
General Aspect 81 year old woman with a history of coronary artery disease, bypass surgery, angioplasty, hypertension, h/o  mechanical falls, admission to hospital 08/01/2012, for atrial fibrillation with RVR, recent falls this past week, presenting with hip fracture. Cardiology was consulted for management of atrial fibrillation, preop eval.  She presents after a mechanical fall at home while trying to walk around without a walker at home. She  tripped and started hurting on her right hip, was found to have right femoral neck fracture. She lay on the ground from 4 Am to 9 AM  She sustained a fall, facedown, last week while she was trying to pick a penny from the parking lot. The patient was brought to the Emergency Room, CT head was negative other than showed a small hematoma of the left forehead. The patient was found to have urinary tract infection and was started on Cipro for enterococcus UTI.   On previous office visits, she had significant bradycardia, continued atrial fibrillation. Digoxin and metoprolol were held. She is bothered by more palpitations, particularly at nighttime . Legs were giving out over the past few weeks. She is unable to cook and do her high-level activities as she did previously.   She does not check her heart rate or blood pressure at home on a regular basis.   Echocardiogram showed ejection fraction 40-45%, mildly dilated left atrium, mild to moderate MR, severe TR with right ventricular systolic pressure 00-92 mm of mercury, mild to moderate aortic valve regurgitation   Present Illness . SOCIAL HISTORY:  Lives at home by herself. Ambulates using a walker. Denies any history of smoking.   FAMILY HISTORY:  Hypertension.   Physical Exam:  GEN well developed   HEENT PERRL, hearing intact to voice   NECK supple   RESP normal resp effort  clear BS   CARD Irregular rate and rhythm  No murmur   ABD denies tenderness  soft   EXTR negative edema   SKIN normal  to palpation   NEURO motor/sensory function intact   PSYCH alert, A+O to time, place, person, good insight   Review of Systems:  Subjective/Chief Complaint right hip pain, weak legs, unsteady gait, recent falls   General: Fatigue  Weakness   Skin: No Complaints   ENT: No Complaints   Eyes: No Complaints   Neck: No Complaints   Respiratory: No Complaints   Cardiovascular: No Complaints   Gastrointestinal: No Complaints   Genitourinary: No Complaints   Vascular: No Complaints   Musculoskeletal: Muscle or joint pain   Neurologic: No Complaints   Hematologic: No Complaints   Endocrine: No Complaints   Psychiatric: No Complaints   Review of Systems: All other systems were reviewed and found to be negative   Medications/Allergies Reviewed Medications/Allergies reviewed     Atrial Fib:    Osteoporosis:    GERD - Esophageal Reflux:    HTN:    Hypercholesterolemia:    bilateral cataract:    Tonsillectomy:    Appendectomy:    Cholecystectomy:    Hysterectomy - Total:    CABG (Coronary Artery Bypass Graft):        Admit Diagnosis:   FX FEMORAL NECK, OSTEOARTHRITIS OF HIP: Onset Date: 12-May-2013, Status: Active, Description: FX FEMORAL NECK, OSTEOARTHRITIS OF HIP  Home Medications: Medication Instructions Status  traMADol 50 mg oral tablet 1 tab(s) orally every 4 hours, As Needed for pain that does not respond to tylenol Active  Klor-Con M20 oral tablet, extended release 1 tab(s) orally once  a day Active  furosemide 20 mg oral tablet 1 tab(s) orally once a day Active  Coumadin 3 mg oral tablet 1 tab(s) orally once a day Active  diltiazem 180 mg/24 hours oral capsule, extended release 1 cap(s) orally once a day Active  aspirin 81 mg oral delayed release tablet 1 tab(s) orally once a day Active  Calcium 600+D 600 mg-200 intl units oral tablet 1 tab(s) orally once a day Active  simvastatin 40 mg oral tablet 1 tab(s) orally once a day Active   meloxicam 15 mg oral tablet 1 tab(s) orally once a day Active  Vitamin D3 400 intl units oral tablet 1 tab(s) orally once a day Active  Lopressor 25 milligram(s) orally once a day Active  PROzac 20 mg oral capsule 1 cap(s) orally once a day Active  acetaminophen 500 mg oral tablet 2 tab(s) orally every 6 hours, As Needed Active   Lab Results:  Routine Chem:  11-Jul-14 04:44   Glucose, Serum  114  BUN  19  Creatinine (comp) 0.88  Sodium, Serum 136  Potassium, Serum 4.1  Chloride, Serum 101  CO2, Serum 27  Calcium (Total), Serum 8.8  Anion Gap 8  Osmolality (calc) 275  eGFR (African American) >60  eGFR (Non-African American)  59 (eGFR values <64m/min/1.73 m2 may be an indication of chronic kidney disease (CKD). Calculated eGFR is useful in patients with stable renal function. The eGFR calculation will not be reliable in acutely ill patients when serum creatinine is changing rapidly. It is not useful in  patients on dialysis. The eGFR calculation may not be applicable to patients at the low and high extremes of body sizes, pregnant women, and vegetarians.)  Routine Coag:  11-Jul-14 04:44   Prothrombin  23.3  INR 2.1 (INR reference interval applies to patients on anticoagulant therapy. A single INR therapeutic range for coumarins is not optimal for all indications; however, the suggested range for most indications is 2.0 - 3.0. Exceptions to the INR Reference Range may include: Prosthetic heart valves, acute myocardial infarction, prevention of myocardial infarction, and combinations of aspirin and anticoagulant. The need for a higher or lower target INR must be assessed individually. Reference: The Pharmacology and Management of the Vitamin K  antagonists: the seventh ACCP Conference on Antithrombotic and Thrombolytic Therapy. CSWFUX.3235Sept:126 (3suppl): [id number] A HCT value >55% may artifactually increase the PT.  In one study,  the increase was an average of  25%. Reference:  "Effect on Routine and Special Coagulation Testing Values of Citrate Anticoagulant Adjustment in Patients with High HCT Values." American Journal of Clinical Pathology 2006;126:400-405.)  Routine Hem:  11-Jul-14 04:44   WBC (CBC) 8.2  RBC (CBC) 4.59  Hemoglobin (CBC)  11.3  Hematocrit (CBC) 35.8  Platelet Count (CBC)  136  MCV  78  MCH  24.6  MCHC  31.5  RDW  17.9  Neutrophil % 62.0  Lymphocyte % 17.7  Monocyte % 20.2  Eosinophil % 0.0  Basophil % 0.1  Neutrophil # 5.1  Lymphocyte # 1.5  Monocyte #  1.7  Eosinophil # 0.0  Basophil # 0.0 (Result(s) reported on 12 May 2013 at 06:11AM.)   EKG:  Interpretation EKG today shows atrial fibrillation with rate 117 beats per minute, poor R-wave progression through the anterior precordial leads, left anterior fascicular block   Radiology Results: XRay:    10-Jul-14 14:27, Chest PA and Lateral  Chest PA and Lateral   REASON FOR EXAM:    generalized weakness  COMMENTS:  May transport without cardiac monitor    PROCEDURE: DXR - DXR CHEST PA (OR AP) AND LATERAL  - May 11 2013  2:27PM     RESULT: Comparison is made to an exam of 09/17/2012. There are small   bilateral pleural effusions. Cardiac silhouette is enlarged with   sternotomy wires present. There is no definite edema, pneumothorax, mass   or infiltrate. The bones are osteopenic. There is a compression fracture   within the thoracic spine which is likelychronic given the appearance on   the previous study.    IMPRESSION:   1. Cardiomegaly with small pleural effusions bilaterally.  Dictation Site: 2        Verified By: Sundra Aland, M.D., MD    No Known Allergies:   Vital Signs/Nurse's Notes: **Vital Signs.:   11-Jul-14 09:20  Vital Signs Type Q 4hr  Temperature Temperature (F) 98.1  Celsius 36.7  Temperature Source oral  Pulse Pulse 111  Respirations Respirations 16  Systolic BP Systolic BP 629  Diastolic BP (mmHg) Diastolic BP (mmHg)  82  Mean BP 97  Pulse Ox % Pulse Ox % 96  Pulse Ox Activity Level  At rest  Oxygen Delivery 2L    Impression 81 year old woman with a history of coronary artery disease, bypass surgery, angioplasty, hypertension, h/o  mechanical falls, admission to hospital 08/01/2012, for atrial fibrillation with RVR, recent falls this past week, presenting with hip fracture. Cardiology was consulted for management of atrial fibrillation, preop eval.   1) Preop cardiovascular eval No recent anginal sx, mildly depressed EF, stable. Atrial fib rate stable, She is very active with no complaints at baseline, legs weaker recently Acceptbale risk for surgery, no further testing needed.  2) CAD, CABG: not an active issue at this time. Would continue asa, metoprolol, statin  3)Atrial fib Would continue diltiazem for rate control Would hold warfrain indefinately given frequent falls Will give Vit K 10 mg x 1 May need FFP if surgery needed tomorrow  4) HTN: stable on metoprolol and diltiazem   Electronic Signatures: Ida Rogue (MD)  (Signed 11-Jul-14 13:44)  Authored: General Aspect/Present Illness, History and Physical Exam, Review of System, Past Medical History, Health Issues, Home Medications, Labs, EKG , Radiology, Allergies, Vital Signs/Nurse's Notes, Impression/Plan   Last Updated: 11-Jul-14 13:44 by Ida Rogue (MD)

## 2015-02-22 NOTE — Discharge Summary (Signed)
PATIENT NAME:  Haley Lee, Haley Lee MR#:  [id number] DATE OF BIRTH:  [birth year]  DATE OF ADMISSION:  05/11/2013 DATE OF DISCHARGE:    ADMITTING DIAGNOSIS: Right hip femoral neck fracture and osteoarthritis.   DISCHARGE DIAGNOSIS: Right femoral neck fracture and osteoarthritis.   PROCEDURE: Right total hip replacement.   ANESTHESIA: General.   SURGEON: Leitha Schuller, M.D.   ESTIMATED BLOOD LOSS: 1000 mL.   COMPLICATIONS: None.   SPECIMEN: Femoral head.   IMPLANTS: A Medacta size M 36 femoral head with a size 5 standard Amis stem collared. A Versa fit cup CC 54 mm with a 20 mm flat hip screw and a size 8 liner, inner diameter of 36 mm.   CONDITION: To recovery room stable.   HISTORY: The patient is an 81 year old Caucasian female with a history of chronic A. fib. on Coumadin, coronary artery disease, status post CABG, history of hypertension, who comes to the Emergency Room after she had a mechanical fall at home while trying to walk around without a walker at home. She tripped and started hurting on her right hip and was found to have a femoral neck fracture. Internal medicine was consulted for preop medical clearance. Per family, the patient had sustained a fall and was face down last week while she was trying to pick up a penny from the parking lot. She was brought to the ER, CT head was negative other than that showed a small a hematoma on the left forehead. The patient was found to have a urinary tract infection was started on Cipro for enterococcus UTI. Today is her second day of treatment. The patient denies any chest pain or shortness of breath. She complains of right hip pain.   PHYSICAL EXAMINATION:  GENERAL: The patient is awake, alert, and oriented x 3.  VITAL SIGNS: Heart rate is 97 to 98, irregularly irregular, blood pressure is 153/83, saturation 92% on 2 L nasal cannula.  HEENT: The patient has a left forehead large hematoma with all over facial bruise. She has some left eye  conjunctival injection. Oral mucosa is moist.  NECK: Supple. No JVD. No carotid bruits.  RESPIRATORY: Decreased breath sounds bilateral bases. No respiratory distress, audible wheezing or crackles.  CARDIOVASCULAR: Irregular/irregular heart rhythm. No murmur heard. PMI is not lateralized. Chest is nontender.  EXTREMITIES: Good pedal pulses. Good femoral pulses. No lower extremity edema.  SKIN: Warm and dry. The patient has a bruise over the tibial shin.  PSYCHIATRIC: The patient is awake, alert, and oriented x 3.   HOSPITAL COURSE: The patient was admitted to the hospital for right hip fracture on 05/11/2013. INR was elevated and she was cleared by medicine for surgery once INR was decreased. On 05/12/2013, Coumadin was discharged by cardiology due to risk of falls. She was started on aspirin and she was given 4 units of fresh frozen plasma. She was started on Cipro for UTI. On 05/13/2013, the patient underwent right total hip replacement. She was given 2 units of packed red blood cells due to a hemoglobin of 7.7 after surgery. On postoperative day 1, 05/14/2013, the patient was tachycardic. Cardiology followed the patient. They increased metoprolol to 25 mg every 8 hours. Her diltiazem was DC'd secondary to low blood pressure and her Lasix was DC'd due to hypertension and hyperperfusion of her kidneys. Hemoglobin was 8.9. On post op day 2, the patient had third transfusion for hemoglobin of 7.8. Increased creatinine of 1.84. She had slow progress in physical therapy. On 05/16/2013,  hemoglobin was up to 9.0, creatinine had trended down to 1.34 after her increased perfusion after transfusion. The patient was doing well. Pain was controlled. She did have slow progress in physical therapy. She was stable and ready for discharge to a rehab facility.   CONDITION AT DISCHARGE: Stable.   DISCHARGE INSTRUCTIONS: The patient may increase weight-bearing on the affected extremity. She needs to wear thigh-high TED  hose on both legs and remove 1 hour per 8 hour shift. Elevate the effected heels off the bed. Use incentive spirometry every hour while awake and encourage cough and deep breathing. She may resume a regular diet as tolerated. Apply an ice pack to the affected area. Do not get the dressing or bandage wet or dirty. [facility] if the dressing gets water under it. Leave the dressing on. [facility] if any of the following occur: Bright red bleeding from the incision or wound, fever above 101.5 degrees, redness, swelling, or drainage at the incision. [facility] if you experience any increased leg pain, numbness or weakness in your legs or bowel or bladder symptoms. The patient needs [facility] in 2 weeks.   DISCHARGE MEDICATIONS: Calcium 600 mg per 200 International Units 1 tablet orally once a day, simvastatin 40 mg 1 tab orally once a day, meloxicam 50 mg 1 tab orally once a day, vitamin D3 400 International Units 1 tab orally once a day, Klor-Con 20 mEq 1 tablet orally once a day, tramadol 50 mg oral tablet 1 tablet orally every 4 hours, Lopressor 25 mg orally once a day, Prozac 20 mg 1 cap orally once a day, Tylenol 500 mg oral tablet 2 tabs orally every 6 hours as needed, Vicodin 5/325 mg 1 to 2 tabs every 6 hours as needed for pain, magnesium hydroxide 8% oral suspension 30 mL orally 2 times a day as needed for constipation, Mylanta 30 mL orally every 6 hours as needed for indigestion or heartburn, aspirin 325 mg delayed-release tablet 1 tablet orally once a day, bisacodyl 10 mg rectal suppository 1 suppository rectally once a day as needed for constipation, docusate/senna 50 mg/8.6 mg tablet 1 tablet orally 2 times a day, Cipro 250 mg oral tablet 1 tablet orally every 12 hours x 3 days, sodium polystyrene sulfonate 15 grams per 16 ML oral suspension 120 mL orally daily, Ensure 240 mL orally 3 times a day with meals.   ____________________________ T. Cranston Neighborhris Gaines,  PA-C tcg:aw D: 05/16/2013 10:47:15 ET T: 05/16/2013 11:04:50 ET JOB#: [id number]  cc: T. Cranston Neighborhris Gaines, PA-C, <Dictator> Evon SlackHOMAS C GAINES GeorgiaPA ELECTRONICALLY SIGNED 06/05/2013 12:30

## 2015-02-22 NOTE — H&P (Signed)
PATIENT NAME:  Haley Lee, Haley Lee MR#:  [id number] DATE OF BIRTH:  1925-03-31  DATE OF ADMISSION:  05/11/2013  PRIMARY CARE PHYSICIAN:  Dr. Maryjane Hurter at [facility], [place].   REFERRING PHYSICIAN:  Dr. Rosita Kea.  CARDIOLOGIST:  Dr. Mariah Milling.   REASON FOR CONSULT:  Preop medical clearance.   HISTORY OF PRESENT ILLNESS:  Haley Lee is an 81 year old Caucasian female with a history of chronic a-fib on Coumadin, coronary artery disease, status post CABG, a history of hypertension, comes to the Emergency Room after she had a mechanical fall at home while trying to walk around without a walker at home. She is tripped and started hurting on her right hip, was found to have right femoral neck fracture. Internal Medicine was consulted for preop medical clearance.    Per family, the patient had sustained a fall, facedown, last week while she was trying to pick a penny from the parking lot. The patient was brought to the Emergency Room, CT head was negative other than showed a small hematoma of the left forehead. The patient was found to have urinary tract infection and was started on Cipro for enterococcus UTI. Today is her second day of treatment. The patient denies any chest pain and shortness of breath. She complains of right hip pain.   PAST MEDICAL HISTORY:  1.  Coronary artery disease status post CABG.  2.  Chronic a-fib, on Coumadin.  3.  Cardiomyopathy with EF of around 40% to 45% by Dr. Mariah Milling with outpatient echo.  4.  Osteoporosis.  5.  GERD.  6.  Hypertension.  7.  Hypercholesterolemia.   PAST SURGICAL HISTORY:  1.  Bilateral cataracts.  2.  CABG.  3.  Tonsillectomy.  4.  Appendectomy.  5.  Cholecystectomy.  6.  Total hysterectomy.   ALLERGIES:  NO KNOWN DRUG ALLERGIES.   MEDICATIONS:  1.  Aspirin 81 mg daily.  2.  Acetaminophen 500 mg 2 tablets every 6 hours as needed.  3.  Calcium with vitamin D 1 tablet daily.  4.  Coumadin 3 mg p.o. daily.  5.  Diltiazem 180 extended release  p.o. daily.  6.  Furosemide 20 mg daily.  7.  Klor-Con 20 mEq daily.  8.  Lopressor 25 mg daily.  9.  Meloxicam 15 mg daily.  10.  Prozac 20 mg daily.  11.  Simvastatin 40 mg daily.  12.  Tramadol 50 mg p q.4 p.r.n.  13.  Vitamin D3 400 international units daily.   SOCIAL HISTORY:  Lives at home by herself. Ambulates using a walker. Denies any history of smoking.   FAMILY HISTORY:  Hypertension.   REVIEW OF SYSTEMS:  CONSTITUTIONAL:  No fever. Positive for fatigue, weakness and right hip pain.  EYES:  No blurred or double vision.  ENT:  No tinnitus, ear pain, hearing loss.  RESPIRATORY:  No cough, wheeze, hemoptysis, or chronic COPD.  CARDIOVASCULAR:  Positive for arrhythmia. No chest pain, palpitations, or orthopnea.  GASTROINTESTINAL:  No nausea, vomiting, diarrhea, abdominal pain.  GENITOURINARY:  No dysuria, hematuria or kidney stones.  ENDOCRINE:  No polyuria, nocturia or thyroid problems.  HEMATOLOGY:  No anemia. There are bruises present in the lower extremities secondary to falls.  SKIN:  No acne. Positive for bruise present over the face along with a hematoma on the left side of the forehead and bruises over the tibial shins bilaterally.  MUSCULOSKELETAL:  Positive for arthritis and right hip pain.  NEUROLOGIC:  No CVA, TIA, no vertigo. Positive for  ataxia.  PSYCHIATRIC:  No anxiety or depression. All other systems reviewed and negative.   PHYSICAL EXAMINATION:  GENERAL:  The patient is awake, alert, oriented x 3.  VITAL SIGNS:  Her heart rate is 97 to 98, irregularly irregular, blood pressure is 153/83. Sats are 92% on 2 L nasal cannula.  HEENT:  The patient has a left forehead large hematoma along with fascial bruise. She has some left eye conjunctival injection. Oral mucosa is moist.  NECK:  Supple. No JVD. No carotid bruit.  RESPIRATORY:  Decreased breath sounds, bilateral bases. No respiratory distress, audible wheezing or crackles.  CARDIOVASCULAR:  Irregularly  irregular heart rhythm. No murmur heard. PMI not lateralized. Chest nontender.  EXTREMITIES:  Good pedal pulses, good femoral pulses. No lower extremity edema.  SKIN:  Warm and dry. The patient has bruise over the tibial shins.  PSYCHIATRIC:  The patient is awake, alert, oriented x 3.   LABORATORY, DIAGNOSTIC, AND RADIOLOGICAL DATA:  EKG shows a-fib with RVR. UA negative for UTI. B-type natriuretic peptide is 753. Cardiac enzymes, the first set is negative with troponin of less than 0.02. INR is 2.3. Creatinine is 0.82, sodium is 138, potassium is 4.8. White count is 8.4, H and H is 11.4 and 36.5. X-ray femur shows a right femoral neck fracture.   CHEST X-RAY:  Cardiomegaly with small pleural effusion bilaterally.   ASSESSMENT:  81-year-old Haley Lee with history of coronary artery disease, status post CABG, chronic atrial fibrillation on Coumadin, comes in after she had sustained a mechanical fall at home, found to have right femoral neck fracture. Internal Medicine is consulted for:  1.  Preop medical clearance for right hip fracture status post mechanical fall at home. The patient is at moderate risk for surgery given her age and medical comorbidities. Okay to take for surgery once INR less than 1.5. Give IV fluids cautiously given her cardiomyopathy and severe TR.  2. Acquired coagulopathy due to Coumadin for chronic atrial fibrillation. The patient has been recently falling at home with fascial bruise and left-sided forehead hematoma. Discontinue Coumadin for now since risk of bleeding is higher given the patient's falls and benefits were low. This was discussed with Dr. Mariah MillingGollan and the patient's daughter and niece were agreeable with it hence we will not to start any Coumadin after surgery. No indication for FFP, okayed for surgery after INR is less than 1.5. Check INR in the morning.  3.  Chronic atrial fibrillation. Heart rate in the 80s. We will resume the patient's beta blockers, that  is Lopressor and diltiazem.  4.  Enterococcus urinary tract infection on Cipro, day 2. Complete a 7-day course.  5.  Coronary artery disease status post CABG. Resume aspirin and statins after the surgery.  6.  Deep vein thrombosis prophylaxis. The patient's INR right now is therapeutic. Later per Dr. Neomia GlassMenz's recommendation. 7.  The above were discussed with the patient and the patient's family members.   TIME SPENT:  50 minutes.   ____________________________ Wylie HailSona A. Allena KatzPatel, MD sap:jm D: 05/11/2013 16:11:28 ET T: 05/11/2013 16:43:29 ET JOB#: [id number]  cc: Bernadean Saling A. Allena KatzPatel, MD, <Dictator> Marina Goodellale E. Feldpausch, MD Leitha SchullerMichael J. Menz, MD Antonieta Ibaimothy J. Gollan, MD Willow OraSONA A Audrie Kuri MD ELECTRONICALLY SIGNED 05/22/2013 19:07
